# Patient Record
Sex: Female | Born: 1965 | Race: White | Hispanic: No | Marital: Married | Smoking: Never smoker
Health system: Southern US, Community
[De-identification: ages and names within clinical notes are randomized; demographics above are authoritative.]

## PROBLEM LIST (undated history)

## (undated) DIAGNOSIS — F329 Major depressive disorder, single episode, unspecified: Secondary | ICD-10-CM

## (undated) DIAGNOSIS — B2790 Infectious mononucleosis, unspecified without complication: Secondary | ICD-10-CM

## (undated) DIAGNOSIS — F419 Anxiety disorder, unspecified: Secondary | ICD-10-CM

## (undated) DIAGNOSIS — O24419 Gestational diabetes mellitus in pregnancy, unspecified control: Secondary | ICD-10-CM

## (undated) DIAGNOSIS — B019 Varicella without complication: Secondary | ICD-10-CM

## (undated) DIAGNOSIS — F32A Depression, unspecified: Secondary | ICD-10-CM

## (undated) DIAGNOSIS — M1990 Unspecified osteoarthritis, unspecified site: Secondary | ICD-10-CM

## (undated) DIAGNOSIS — G43909 Migraine, unspecified, not intractable, without status migrainosus: Secondary | ICD-10-CM

## (undated) DIAGNOSIS — R7303 Prediabetes: Secondary | ICD-10-CM

## (undated) DIAGNOSIS — E785 Hyperlipidemia, unspecified: Secondary | ICD-10-CM

## (undated) DIAGNOSIS — T7840XA Allergy, unspecified, initial encounter: Secondary | ICD-10-CM

## (undated) DIAGNOSIS — D369 Benign neoplasm, unspecified site: Secondary | ICD-10-CM

## (undated) HISTORY — DX: Infectious mononucleosis, unspecified without complication: B27.90

## (undated) HISTORY — DX: Allergy, unspecified, initial encounter: T78.40XA

## (undated) HISTORY — PX: CARPAL TUNNEL RELEASE: SHX101

## (undated) HISTORY — DX: Anxiety disorder, unspecified: F41.9

## (undated) HISTORY — DX: Major depressive disorder, single episode, unspecified: F32.9

## (undated) HISTORY — DX: Varicella without complication: B01.9

## (undated) HISTORY — DX: Migraine, unspecified, not intractable, without status migrainosus: G43.909

## (undated) HISTORY — DX: Depression, unspecified: F32.A

## (undated) HISTORY — DX: Gestational diabetes mellitus in pregnancy, unspecified control: O24.419

## (undated) HISTORY — DX: Hyperlipidemia, unspecified: E78.5

## (undated) HISTORY — DX: Benign neoplasm, unspecified site: D36.9

---
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
## 2000-08-31 ENCOUNTER — Encounter: Admission: RE | Admit: 2000-08-31 | Discharge: 2000-08-31 | Payer: Self-pay | Admitting: Internal Medicine

## 2005-08-31 ENCOUNTER — Ambulatory Visit: Payer: Self-pay

## 2005-09-28 ENCOUNTER — Ambulatory Visit: Payer: Self-pay | Admitting: Family Medicine

## 2013-11-19 ENCOUNTER — Ambulatory Visit: Payer: Self-pay | Admitting: Family Medicine

## 2015-10-30 IMAGING — CR RIGHT INDEX FINGER 2+V
1 series · 4 of 4 positions shown · non-contrast
Comparison: None.

CLINICAL DATA: Naana hit 2nd digit.

EXAM:
RIGHT INDEX FINGER 2+V

[Series 1: x finger obl right · 0.14mm/px · 4 of 4 slices shown]
[im 1/4]
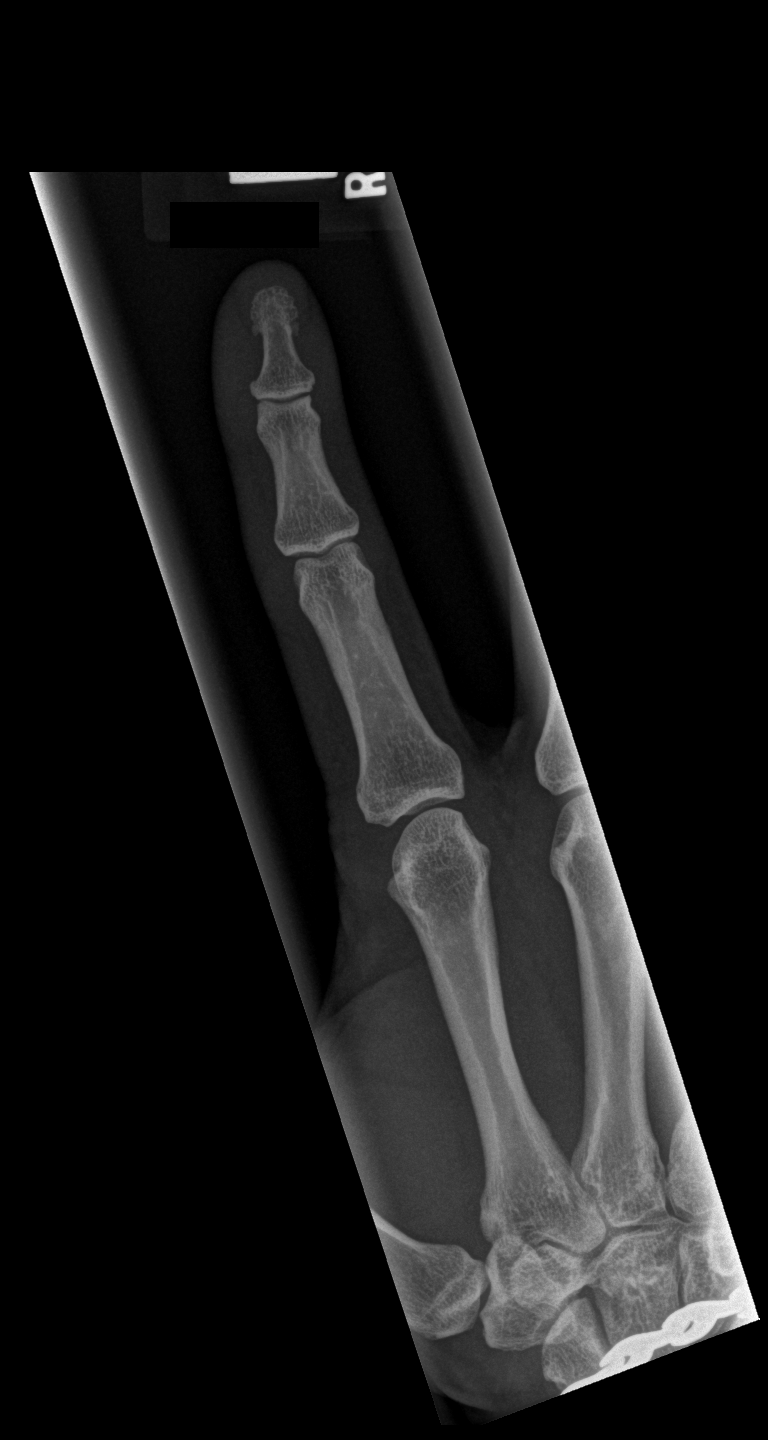
[im 2/4]
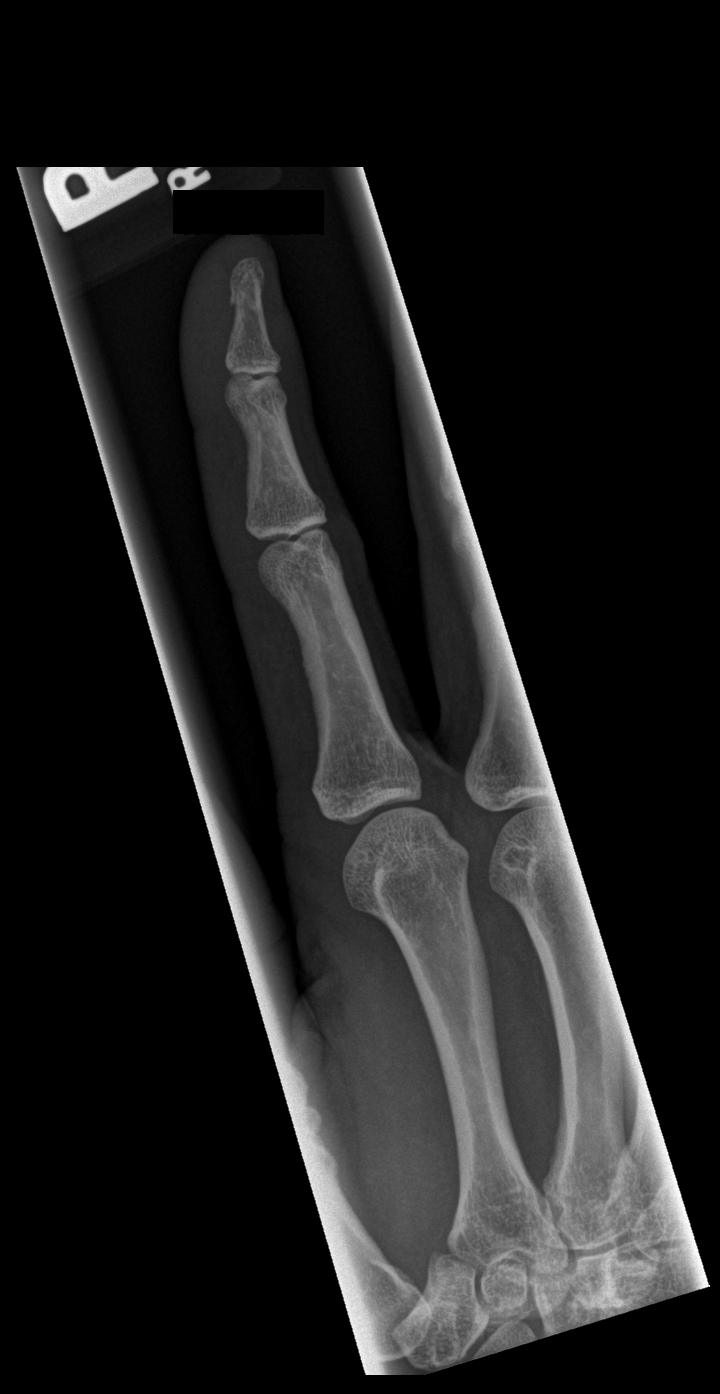
[im 3/4]
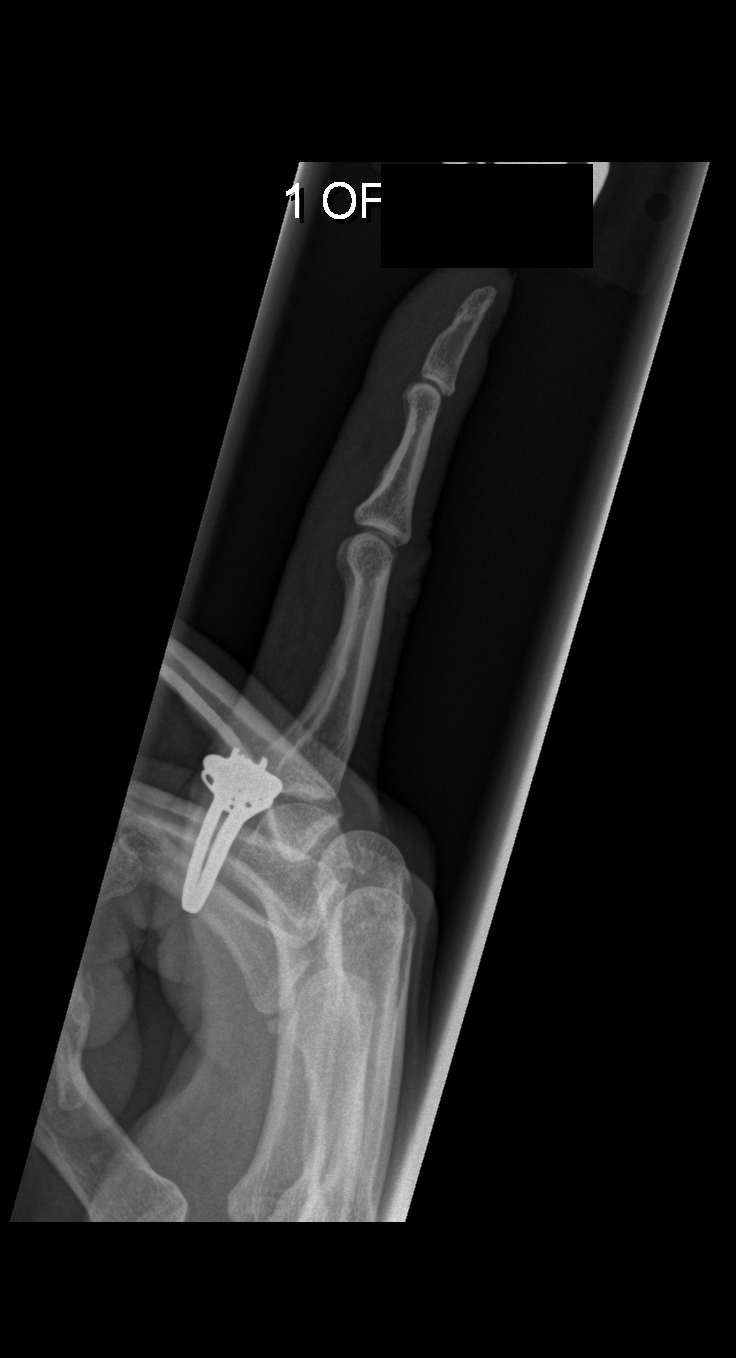
[im 4/4]
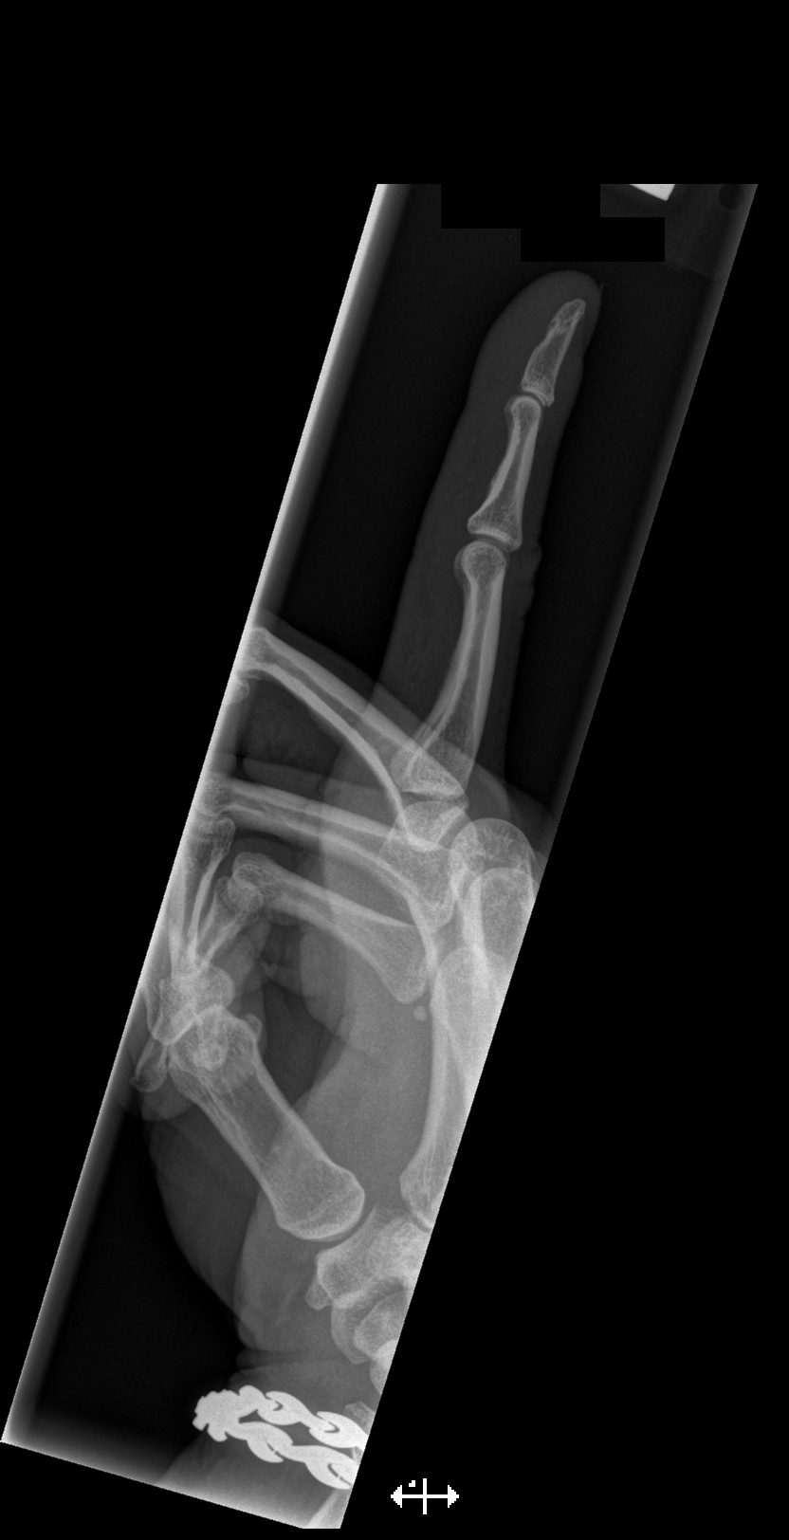

[4 of 4 positions shown; findings below may reference images not displayed]

FINDINGS: There is a possible minimally displaced fracture involving the ulnar
aspect of the tuft of the distal phalanx. No other fractures are
identified. Alignment is normal. No radiopaque foreign body or soft
tissue gas.
IMPRESSION: Possible tuft fracture.

## 2015-12-01 ENCOUNTER — Encounter: Payer: Self-pay | Admitting: Family Medicine

## 2015-12-01 ENCOUNTER — Ambulatory Visit (INDEPENDENT_AMBULATORY_CARE_PROVIDER_SITE_OTHER): Payer: 59 | Admitting: Family Medicine

## 2015-12-01 VITALS — BP 124/82 | HR 83 | Temp 99.0°F | Ht 64.25 in | Wt 178.2 lb

## 2015-12-01 DIAGNOSIS — M7062 Trochanteric bursitis, left hip: Secondary | ICD-10-CM | POA: Diagnosis not present

## 2015-12-01 DIAGNOSIS — Z0000 Encounter for general adult medical examination without abnormal findings: Secondary | ICD-10-CM | POA: Diagnosis not present

## 2015-12-01 DIAGNOSIS — F418 Other specified anxiety disorders: Secondary | ICD-10-CM

## 2015-12-01 DIAGNOSIS — J209 Acute bronchitis, unspecified: Secondary | ICD-10-CM

## 2015-12-01 MED ORDER — DOXYCYCLINE HYCLATE 100 MG PO TABS: 100.0000 mg | ORAL_TABLET | Freq: Two times a day (BID) | ORAL | Status: DC

## 2015-12-01 MED ORDER — HYDROCOD POLST-CPM POLST ER 10-8 MG/5ML PO SUER: 5.0000 mL | Freq: Two times a day (BID) | ORAL | Status: DC | PRN

## 2015-12-01 MED ORDER — MELOXICAM 15 MG PO TABS
15.0000 mg | ORAL_TABLET | Freq: Every day | ORAL | Status: DC
Start: 2015-12-01 — End: 2016-03-09

## 2015-12-01 NOTE — Progress Notes (Signed)
Pre visit review using our clinic review tool, if applicable. No additional management support is needed unless otherwise documented below in the visit note. 

## 2015-12-01 NOTE — Patient Instructions (Signed)
It was nice to see you today.  Use the cough medication at night.  Take the antibiotic as prescribed.  Be sure to get your mammogram.  Use the Mobic daily as needed for your hip pain.  Follow up annually or sooner if needed.  Take care  Dr. Lacinda Axon

## 2015-12-02 ENCOUNTER — Encounter: Payer: Self-pay | Admitting: Family Medicine

## 2015-12-02 DIAGNOSIS — M7060 Trochanteric bursitis, unspecified hip: Secondary | ICD-10-CM | POA: Insufficient documentation

## 2015-12-02 DIAGNOSIS — F418 Other specified anxiety disorders: Secondary | ICD-10-CM | POA: Insufficient documentation

## 2015-12-02 DIAGNOSIS — Z0000 Encounter for general adult medical examination without abnormal findings: Secondary | ICD-10-CM | POA: Insufficient documentation

## 2015-12-02 NOTE — Assessment & Plan Note (Signed)
New problem. Treating with Mobic.

## 2015-12-02 NOTE — Assessment & Plan Note (Signed)
Discussed increase of celexa. Patient declined and wanted to stay on current dose. Current stressors are centered upon work.

## 2015-12-02 NOTE — Progress Notes (Addendum)
Subjective:  Patient ID: Tamara Weeks, female    DOB: May 18, 1966  Age: 49 y.o. MRN: [id number]  CC: Establish care; Cough, Hip pain  HPI Tamara Weeks is a 49 y.o. female presents to the clinic today to establish care. She also has 2 additional complaints (see below).  Preventative Healthcare  Pap smear: Up to date. Done in 2015.  Mammogram/Breast exam: In need of mammogram later this month. Getting via Gyn.  Colonoscopy: N/A.  Immunizations  Tetanus - Up to date per patient report (w/in last 10 years).  Pneumococcal - N/A.  Flu - received earlier in November.  Zoster - N/A.  Hepatitis C screening - N/A.  Labs: In need of labs today.  Exercise: Does not exercise regularly.  Alcohol use: See below.  Smoking/tobacco use: No.  STD/HIV testing: Via Gyn.  Regular dental exams: Yes.   Wears seat belt: Yes.   Cough  Has been present x 2 weeks.  Mildly productive.  She reports associated post nasal drip.  Cough is mildly productive.  No associated fevers.  Patient does endorse some associated shortness of breath with a cough.  He's had no relief with supportive care at home.  No exacerbating or relieving factors.  Left hip pain  Has been present for approx. 1 week.  Patient states that she hasn't area that is quite tender to palpation.  This area is located on the lateral hip.  She reports that this area is quite bothersome when she rolls over onto it at night.  No relieving factors.  She is went to chiropractor and has had little improvement.  No known inciting factor. No trauma, fall, injury. Patient does note that she sits a lot at work.  Pain is mild to moderate in severity.  PMH, Surgical Hx, Family Hx, Social History reviewed and updated as below.  Past Medical History  Diagnosis Date  . Chicken pox   . Depression   . Allergy   . Migraines    Past Surgical History  Procedure Laterality Date  . Tonsillectomy and  adenoidectomy  1975  . Cesarean section  1997   Family History  Problem Relation Age of Onset  . Arthritis Mother   . Arthritis Maternal Grandmother   . Hyperlipidemia Maternal Grandmother   . Diabetes Maternal Grandmother   . Stroke Maternal Grandfather   . Hyperlipidemia Maternal Grandfather   . Hyperlipidemia Paternal Grandmother   . Diabetes Paternal Grandmother   . Hyperlipidemia Paternal Grandfather    Social History  Substance Use Topics  . Smoking status: Never Smoker   . Smokeless tobacco: Never Used  . Alcohol Use: 2.4 oz/week    4 Standard drinks or equivalent per week   Review of Systems  Constitutional: Positive for fatigue.  HENT: Positive for postnasal drip and voice change.   Respiratory: Positive for cough and shortness of breath.   Cardiovascular: Positive for palpitations.  Musculoskeletal: Positive for arthralgias.  Psychiatric/Behavioral:       Anxiety, stress.  All other systems reviewed and are negative.  Objective:   Today's Vitals: BP 124/82 mmHg  Pulse 83  Temp(Src) 99 F (37.2 C) (Oral)  Ht 5' 4.25" (1.632 m)  Wt 178 lb 4 oz (80.854 kg)  BMI 30.36 kg/m2  SpO2 97%  LMP 11/22/2015  Physical Exam  Constitutional: She is oriented to person, place, and time. She appears well-developed. No distress.  HENT:  Head: Normocephalic and atraumatic.  Right Ear: External ear normal.  Left Ear:  External ear normal.  Moderate oropharyngeal erythema. Normal TM's bilaterally.  Eyes: Conjunctivae are normal. No scleral icterus.  Neck: Neck supple.  Cardiovascular: Normal rate and regular rhythm.   No murmur heard. Pulmonary/Chest: Effort normal and breath sounds normal. No respiratory distress. She has no wheezes. She has no rales.  Abdominal: Soft. She exhibits no distension. There is no tenderness. There is no rebound and no guarding.  Musculoskeletal:  Hip: Left Greater trochanter with tenderness to palpation.   Lymphadenopathy:    She has no  cervical adenopathy.  Neurological: She is alert and oriented to person, place, and time.  Skin: Skin is warm and dry. No rash noted.  Psychiatric: She has a normal mood and affect.  Vitals reviewed.  Assessment & Plan:   Problem List Items Addressed This Visit    Trochanteric bursitis    New problem. Treating with Mobic.      Preventative health care - Primary    Immunizations up to date. Pap smear up to date. Mammogram scheduled for later this month. Labs today: CBC, CMP, A1C, Lipid.       Depression with anxiety    Discussed increase of celexa. Patient declined and wanted to stay on current dose. Current stressors are centered upon work.      Acute bronchitis    New problem. Discussed that antibiotics have limited benefit in this setting; however, given duration of illness/lack of improvement in the past 2 weeks will treat with Doxycycline. Tussionex for cough.         Outpatient Encounter Prescriptions as of 12/01/2015  Medication Sig  . citalopram (CELEXA) 10 MG tablet Take 10 mg by mouth daily.  . fluticasone (FLONASE) 50 MCG/ACT nasal spray Place 2 sprays into both nostrils daily.  . chlorpheniramine-HYDROcodone (TUSSIONEX PENNKINETIC ER) 10-8 MG/5ML SUER Take 5 mLs by mouth every 12 (twelve) hours as needed for cough.  . doxycycline (VIBRA-TABS) 100 MG tablet Take 1 tablet (100 mg total) by mouth 2 (two) times daily.  . meloxicam (MOBIC) 15 MG tablet Take 1 tablet (15 mg total) by mouth daily.   No facility-administered encounter medications on file as of 12/01/2015.    Follow-up: Annually or sooner if needed  [place]

## 2015-12-02 NOTE — Assessment & Plan Note (Signed)
Immunizations up to date. Pap smear up to date. Mammogram scheduled for later this month. Labs today: CBC, CMP, A1C, Lipid.

## 2015-12-02 NOTE — Assessment & Plan Note (Addendum)
New problem. Discussed that antibiotics have limited benefit in this setting; however, given duration of illness/lack of improvement in the past 2 weeks will treat with Doxycycline. Tussionex for cough.

## 2015-12-17 LAB — HM MAMMOGRAPHY: HM Mammogram: NEGATIVE

## 2015-12-19 ENCOUNTER — Telehealth: Payer: Self-pay | Admitting: Family Medicine

## 2015-12-19 NOTE — Telephone Encounter (Signed)
Pt called about worse then she was before. She states she has a fever of about 100.4 and blowing out yellow mucus and coughing up yellow mucus and nose bleeding. Pt wants to know can something be called in? Pharmacy is [facility], [place]. Thank You!

## 2015-12-19 NOTE — Telephone Encounter (Signed)
Patient will need to be seen or she may go to Urgent care.

## 2015-12-19 NOTE — Telephone Encounter (Signed)
Patient was seen on 12/12 and prescribed Doxycycline and tussinex.  Please advise?

## 2015-12-19 NOTE — Telephone Encounter (Signed)
Patient said she will go to the urgent care beacause she could not wait for Tuesday to get a appointment.

## 2015-12-29 ENCOUNTER — Encounter: Payer: Self-pay | Admitting: Family Medicine

## 2015-12-29 ENCOUNTER — Ambulatory Visit (INDEPENDENT_AMBULATORY_CARE_PROVIDER_SITE_OTHER): Payer: 59 | Admitting: Family Medicine

## 2015-12-29 VITALS — BP 124/72 | HR 83 | Temp 98.4°F | Ht 64.25 in | Wt 180.0 lb

## 2015-12-29 DIAGNOSIS — J019 Acute sinusitis, unspecified: Secondary | ICD-10-CM | POA: Diagnosis not present

## 2015-12-29 DIAGNOSIS — R059 Cough, unspecified: Secondary | ICD-10-CM | POA: Insufficient documentation

## 2015-12-29 DIAGNOSIS — R05 Cough: Secondary | ICD-10-CM | POA: Diagnosis not present

## 2015-12-29 MED ORDER — PREDNISONE 50 MG PO TABS: ORAL_TABLET | ORAL | Status: DC

## 2015-12-29 MED ORDER — MOXIFLOXACIN HCL 400 MG PO TABS: 400.0000 mg | ORAL_TABLET | Freq: Every day | ORAL | Status: DC

## 2015-12-29 MED ORDER — HYDROCOD POLST-CPM POLST ER 10-8 MG/5ML PO SUER: 5.0000 mL | Freq: Two times a day (BID) | ORAL | Status: DC | PRN

## 2015-12-29 NOTE — Progress Notes (Signed)
Subjective:  Patient ID: Tamara Weeks, female    DOB: 19-Nov-1966  Age: 50 y.o. MRN: [id number]  CC: Still sick  HPI:  50 year old female presents to clinic today for an acute visit with complaints of not feeling well.  Patient has been sick for several weeks now. I initially saw her on 12/12 and diagnosed her with acute bronchitis. She was treated with supportive care and was given doxycycline to be filled if she failed to improve or worsen. She subsequently filled this medication and had some improvement briefly and then got sick again. She states that she been experiencing continued cough as well as postnasal drip and nasal obstruction/congestion. Her symptoms have been persistent prodding her to seek medical attention at a local urgent care approximately week ago. She states at that time she was diagnosed with acute sinusitis. She was treated with Biaxin.  She presents today reporting that she is still no better. She continues to have severe cough, nasal drainage, and nasal obstruction. She reports associated fatigue. She has had fever but none recently. No relieving factors with the above treatments. No known exacerbating factors.  Social Hx   Social History   Social History  . Marital Status: Married    Spouse Name: N/A  . Number of Children: N/A  . Years of Education: N/A   Social History Main Topics  . Smoking status: Never Smoker   . Smokeless tobacco: Never Used  . Alcohol Use: 2.4 oz/week    4 Standard drinks or equivalent per week  . Drug Use: No  . Sexual Activity: No   Other Topics Concern  . None   Social History Narrative   Review of Systems  Constitutional:       No recent fever.   HENT: Positive for congestion and sinus pressure.   Respiratory: Positive for cough.    Objective:  BP 124/72 mmHg  Pulse 83  Temp(Src) 98.4 F (36.9 C) (Oral)  Ht 5' 4.25" (1.632 m)  Wt 180 lb (81.647 kg)  BMI 30.65 kg/m2  SpO2 98%  LMP 11/22/2015  BP/Weight  12/29/2015 [id number]  Systolic BP [id number]  Diastolic BP 72 82  Wt. (Lbs) 180 178.25  BMI 30.65 30.36   Physical Exam  Constitutional: She appears well-developed. No distress.  HENT:  Head: Normocephalic and atraumatic.  Nose: Mucosal edema present.  Mouth/Throat: No oropharyngeal exudate.  Mild maxillary sinus tenderness.  Cardiovascular: Normal rate and regular rhythm.   Pulmonary/Chest: Effort normal and breath sounds normal.  Neurological: She is alert.  Psychiatric: She has a normal mood and affect.  Vitals reviewed.   Assessment & Plan:   Problem List Items Addressed This Visit    Subacute sinusitis - Primary    New problem. Persistent symptoms despite antibiotic therapy. Treating with Prednisone and Avelox. If fails to improve will need to see ENT.      Relevant Medications   predniSONE (DELTASONE) 50 MG tablet   moxifloxacin (AVELOX) 400 MG tablet   chlorpheniramine-HYDROcodone (TUSSIONEX PENNKINETIC ER) 10-8 MG/5ML SUER   Other Relevant Orders   Ambulatory referral to ENT   Cough    Treating with Tussionex.      Relevant Medications   chlorpheniramine-HYDROcodone (TUSSIONEX PENNKINETIC ER) 10-8 MG/5ML SUER      Meds ordered this encounter  Medications  . predniSONE (DELTASONE) 50 MG tablet    Sig: 1 tablet daily x 5 days.    Dispense:  5 tablet    Refill:  0  .  moxifloxacin (AVELOX) 400 MG tablet    Sig: Take 1 tablet (400 mg total) by mouth daily at 8 pm.    Dispense:  10 tablet    Refill:  0  . chlorpheniramine-HYDROcodone (TUSSIONEX PENNKINETIC ER) 10-8 MG/5ML SUER    Sig: Take 5 mLs by mouth every 12 (twelve) hours as needed for cough.    Dispense:  140 mL    Refill:  0    Follow-up: PRN  [place]

## 2015-12-29 NOTE — Assessment & Plan Note (Signed)
Treating with Tussionex.

## 2015-12-29 NOTE — Patient Instructions (Addendum)
Continue the flonase.  Start the prednisone.  Take the antibiotic (stop the celexa while on it).  We will have you see ENT.  Take care  Dr. Lacinda Axon

## 2015-12-29 NOTE — Progress Notes (Signed)
Pre visit review using our clinic review tool, if applicable. No additional management support is needed unless otherwise documented below in the visit note. 

## 2015-12-29 NOTE — Assessment & Plan Note (Signed)
New problem. Persistent symptoms despite antibiotic therapy. Treating with Prednisone and Avelox. If fails to improve will need to see ENT.

## 2016-01-09 ENCOUNTER — Encounter: Payer: Self-pay | Admitting: Family Medicine

## 2016-01-13 ENCOUNTER — Encounter: Payer: Self-pay | Admitting: Family Medicine

## 2016-02-18 ENCOUNTER — Encounter: Payer: 59 | Admitting: Family Medicine

## 2016-03-03 ENCOUNTER — Encounter: Payer: 59 | Admitting: Family Medicine

## 2016-03-03 ENCOUNTER — Other Ambulatory Visit: Payer: Self-pay | Admitting: Family Medicine

## 2016-03-04 LAB — CBC WITH DIFFERENTIAL/PLATELET
BASOS ABS: 0 10*3/uL (ref 0.0–0.2)
BASOS: 0 %
EOS (ABSOLUTE): 0.1 10*3/uL (ref 0.0–0.4)
EOS: 1 %
HEMATOCRIT: 42.2 % (ref 34.0–46.6)
HEMOGLOBIN: 14.6 g/dL (ref 11.1–15.9)
IMMATURE GRANS (ABS): 0 10*3/uL (ref 0.0–0.1)
Immature Granulocytes: 0 %
LYMPHS ABS: 1.7 10*3/uL (ref 0.7–3.1)
LYMPHS: 31 %
MCH: 29.6 pg (ref 26.6–33.0)
MCHC: 34.6 g/dL (ref 31.5–35.7)
MCV: 85 fL (ref 79–97)
MONOCYTES: 6 %
Monocytes Absolute: 0.3 10*3/uL (ref 0.1–0.9)
NEUTROS ABS: 3.4 10*3/uL (ref 1.4–7.0)
Neutrophils: 62 %
PLATELETS: 356 10*3/uL (ref 150–379)
RBC: 4.94 x10E6/uL (ref 3.77–5.28)
RDW: 13.3 % (ref 12.3–15.4)
WBC: 5.5 10*3/uL (ref 3.4–10.8)

## 2016-03-04 LAB — COMPREHENSIVE METABOLIC PANEL
ALBUMIN: 4.5 g/dL (ref 3.5–5.5)
ALK PHOS: 38 IU/L — AB (ref 39–117)
ALT: 28 IU/L (ref 0–32)
AST: 30 IU/L (ref 0–40)
Albumin/Globulin Ratio: 1.9 (ref 1.2–2.2)
BUN / CREAT RATIO: 13 (ref 9–23)
BUN: 10 mg/dL (ref 6–24)
Bilirubin Total: 0.6 mg/dL (ref 0.0–1.2)
CALCIUM: 9.4 mg/dL (ref 8.7–10.2)
CO2: 24 mmol/L (ref 18–29)
CREATININE: 0.77 mg/dL (ref 0.57–1.00)
Chloride: 94 mmol/L — ABNORMAL LOW (ref 96–106)
GFR calc non Af Amer: 90 mL/min/{1.73_m2} (ref >59)
GFR, EST AFRICAN AMERICAN: 104 mL/min/{1.73_m2} (ref >59)
GLUCOSE: 93 mg/dL (ref 65–99)
Globulin, Total: 2.4 g/dL (ref 1.5–4.5)
Potassium: 4.1 mmol/L (ref 3.5–5.2)
Sodium: 137 mmol/L (ref 134–144)
TOTAL PROTEIN: 6.9 g/dL (ref 6.0–8.5)

## 2016-03-04 LAB — LIPID PANEL W/O CHOL/HDL RATIO
CHOLESTEROL TOTAL: 214 mg/dL — AB (ref 100–199)
HDL: 56 mg/dL (ref >39)
LDL CALC: 140 mg/dL — AB (ref 0–99)
Triglycerides: 92 mg/dL (ref 0–149)
VLDL CHOLESTEROL CAL: 18 mg/dL (ref 5–40)

## 2016-03-04 LAB — HGB A1C W/O EAG: HEMOGLOBIN A1C: 5.8 % — AB (ref 4.8–5.6)

## 2016-03-09 ENCOUNTER — Ambulatory Visit (INDEPENDENT_AMBULATORY_CARE_PROVIDER_SITE_OTHER): Payer: 59 | Admitting: Family Medicine

## 2016-03-09 ENCOUNTER — Encounter: Payer: Self-pay | Admitting: Family Medicine

## 2016-03-09 VITALS — BP 102/72 | HR 87 | Temp 98.1°F | Ht 64.75 in | Wt 179.5 lb

## 2016-03-09 DIAGNOSIS — Z0000 Encounter for general adult medical examination without abnormal findings: Secondary | ICD-10-CM | POA: Diagnosis not present

## 2016-03-09 MED ORDER — CITALOPRAM HYDROBROMIDE 20 MG PO TABS: 20.0000 mg | ORAL_TABLET | Freq: Every day | ORAL | Status: DC

## 2016-03-09 NOTE — Progress Notes (Signed)
Pre visit review using our clinic review tool, if applicable. No additional management support is needed unless otherwise documented below in the visit note. 

## 2016-03-09 NOTE — Patient Instructions (Addendum)
It was nice to see you today.  I have refilled your celexa.  Follow up in 1 year or sooner if needed.  We will call with your colonoscopy.    Follow up:  Return in about 1 year (around 03/09/2017).  Take care  Dr. Lacinda Axon  Health Maintenance, Female Adopting a healthy lifestyle and getting preventive care can go a long way to promote health and wellness. Talk with your health care provider about what schedule of regular examinations is right for you. This is a good chance for you to check in with your provider about disease prevention and staying healthy. In between checkups, there are plenty of things you can do on your own. Experts have done a lot of research about which lifestyle changes and preventive measures are most likely to keep you healthy. Ask your health care provider for more information. WEIGHT AND DIET  Eat a healthy diet  Be sure to include plenty of vegetables, fruits, low-fat dairy products, and lean protein.  Do not eat a lot of foods high in solid fats, added sugars, or salt.  Get regular exercise. This is one of the most important things you can do for your health.  Most adults should exercise for at least 150 minutes each week. The exercise should increase your heart rate and make you sweat (moderate-intensity exercise).  Most adults should also do strengthening exercises at least twice a week. This is in addition to the moderate-intensity exercise.  Maintain a healthy weight  Body mass index (BMI) is a measurement that can be used to identify possible weight problems. It estimates body fat based on height and weight. Your health care provider can help determine your BMI and help you achieve or maintain a healthy weight.  For females 37 years of age and older:   A BMI below 18.5 is considered underweight.  A BMI of 18.5 to 24.9 is normal.  A BMI of 25 to 29.9 is considered overweight.  A BMI of 30 and above is considered obese.  Watch levels of  cholesterol and blood lipids  You should start having your blood tested for lipids and cholesterol at 50 years of age, then have this test every 5 years.  You may need to have your cholesterol levels checked more often if:  Your lipid or cholesterol levels are high.  You are older than 50 years of age.  You are at high risk for heart disease.  CANCER SCREENING   Lung Cancer  Lung cancer screening is recommended for adults 53-16 years old who are at high risk for lung cancer because of a history of smoking.  A yearly low-dose CT scan of the lungs is recommended for people who:  Currently smoke.  Have quit within the past 15 years.  Have at least a 30-pack-year history of smoking. A pack year is smoking an average of one pack of cigarettes a day for 1 year.  Yearly screening should continue until it has been 15 years since you quit.  Yearly screening should stop if you develop a health problem that would prevent you from having lung cancer treatment.  Breast Cancer  Practice breast self-awareness. This means understanding how your breasts normally appear and feel.  It also means doing regular breast self-exams. Let your health care provider know about any changes, no matter how small.  If you are in your 20s or 30s, you should have a clinical breast exam (CBE) by a health care provider every 1-3 years  as part of a regular health exam.  If you are 59 or older, have a CBE every year. Also consider having a breast X-ray (mammogram) every year.  If you have a family history of breast cancer, talk to your health care provider about genetic screening.  If you are at high risk for breast cancer, talk to your health care provider about having an MRI and a mammogram every year.  Breast cancer gene (BRCA) assessment is recommended for women who have family members with BRCA-related cancers. BRCA-related cancers include:  Breast.  Ovarian.  Tubal.  Peritoneal  cancers.  Results of the assessment will determine the need for genetic counseling and BRCA1 and BRCA2 testing. Cervical Cancer Your health care provider may recommend that you be screened regularly for cancer of the pelvic organs (ovaries, uterus, and vagina). This screening involves a pelvic examination, including checking for microscopic changes to the surface of your cervix (Pap test). You may be encouraged to have this screening done every 3 years, beginning at age 41.  For women ages 13-65, health care providers may recommend pelvic exams and Pap testing every 3 years, or they may recommend the Pap and pelvic exam, combined with testing for human papilloma virus (HPV), every 5 years. Some types of HPV increase your risk of cervical cancer. Testing for HPV may also be done on women of any age with unclear Pap test results.  Other health care providers may not recommend any screening for nonpregnant women who are considered low risk for pelvic cancer and who do not have symptoms. Ask your health care provider if a screening pelvic exam is right for you.  If you have had past treatment for cervical cancer or a condition that could lead to cancer, you need Pap tests and screening for cancer for at least 20 years after your treatment. If Pap tests have been discontinued, your risk factors (such as having a new sexual partner) need to be reassessed to determine if screening should resume. Some women have medical problems that increase the chance of getting cervical cancer. In these cases, your health care provider may recommend more frequent screening and Pap tests. Colorectal Cancer  This type of cancer can be detected and often prevented.  Routine colorectal cancer screening usually begins at 50 years of age and continues through 50 years of age.  Your health care provider may recommend screening at an earlier age if you have risk factors for colon cancer.  Your health care provider may also  recommend using home test kits to check for hidden blood in the stool.  A small camera at the end of a tube can be used to examine your colon directly (sigmoidoscopy or colonoscopy). This is done to check for the earliest forms of colorectal cancer.  Routine screening usually begins at age 9.  Direct examination of the colon should be repeated every 5-10 years through 50 years of age. However, you may need to be screened more often if early forms of precancerous polyps or small growths are found. Skin Cancer  Check your skin from head to toe regularly.  Tell your health care provider about any new moles or changes in moles, especially if there is a change in a mole's shape or color.  Also tell your health care provider if you have a mole that is larger than the size of a pencil eraser.  Always use sunscreen. Apply sunscreen liberally and repeatedly throughout the [place].  Protect yourself by wearing long sleeves,  pants, a wide-brimmed hat, and sunglasses whenever you are outside. HEART DISEASE, DIABETES, AND HIGH BLOOD PRESSURE   High blood pressure causes heart disease and increases the risk of stroke. High blood pressure is more likely to develop in:  People who have blood pressure in the high end of the normal range (130-139/85-89 mm Hg).  People who are overweight or obese.  People who are African American.  If you are 22-60 years of age, have your blood pressure checked every 3-5 years. If you are 61 years of age or older, have your blood pressure checked every year. You should have your blood pressure measured twice--once when you are at a hospital or clinic, and once when you are not at a hospital or clinic. Record the average of the two measurements. To check your blood pressure when you are not at a hospital or clinic, you can use:  An automated blood pressure machine at a pharmacy.  A home blood pressure monitor.  If you are between 29 years and 55 years old, ask your health  care provider if you should take aspirin to prevent strokes.  Have regular diabetes screenings. This involves taking a blood sample to check your fasting blood sugar level.  If you are at a normal weight and have a low risk for diabetes, have this test once every three years after 50 years of age.  If you are overweight and have a high risk for diabetes, consider being tested at a younger age or more often. PREVENTING INFECTION  Hepatitis B  If you have a higher risk for hepatitis B, you should be screened for this virus. You are considered at high risk for hepatitis B if:  You were born in a country where hepatitis B is common. Ask your health care provider which countries are considered high risk.  Your parents were born in a high-risk country, and you have not been immunized against hepatitis B (hepatitis B vaccine).  You have HIV or AIDS.  You use needles to inject street drugs.  You live with someone who has hepatitis B.  You have had sex with someone who has hepatitis B.  You get hemodialysis treatment.  You take certain medicines for conditions, including cancer, organ transplantation, and autoimmune conditions. Hepatitis C  Blood testing is recommended for:  Everyone born from [birth year] through 1965.  Anyone with known risk factors for hepatitis C. Sexually transmitted infections (STIs)  You should be screened for sexually transmitted infections (STIs) including gonorrhea and chlamydia if:  You are sexually active and are younger than 50 years of age.  You are older than 50 years of age and your health care provider tells you that you are at risk for this type of infection.  Your sexual activity has changed since you were last screened and you are at an increased risk for chlamydia or gonorrhea. Ask your health care provider if you are at risk.  If you do not have HIV, but are at risk, it may be recommended that you take a prescription medicine daily to prevent HIV  infection. This is called pre-exposure prophylaxis (PrEP). You are considered at risk if:  You are sexually active and do not regularly use condoms or know the HIV status of your partner(s).  You take drugs by injection.  You are sexually active with a partner who has HIV. Talk with your health care provider about whether you are at high risk of being infected with HIV. If you choose to  begin PrEP, you should first be tested for HIV. You should then be tested every 3 months for as long as you are taking PrEP.  PREGNANCY   If you are premenopausal and you may become pregnant, ask your health care provider about preconception counseling.  If you may become pregnant, take 400 to 800 micrograms (mcg) of folic acid every day.  If you want to prevent pregnancy, talk to your health care provider about birth control (contraception). OSTEOPOROSIS AND MENOPAUSE   Osteoporosis is a disease in which the bones lose minerals and strength with aging. This can result in serious bone fractures. Your risk for osteoporosis can be identified using a bone density scan.  If you are 10 years of age or older, or if you are at risk for osteoporosis and fractures, ask your health care provider if you should be screened.  Ask your health care provider whether you should take a calcium or vitamin D supplement to lower your risk for osteoporosis.  Menopause may have certain physical symptoms and risks.  Hormone replacement therapy may reduce some of these symptoms and risks. Talk to your health care provider about whether hormone replacement therapy is right for you.  HOME CARE INSTRUCTIONS   Schedule regular health, dental, and eye exams.  Stay current with your immunizations.   Do not use any tobacco products including cigarettes, chewing tobacco, or electronic cigarettes.  If you are pregnant, do not drink alcohol.  If you are breastfeeding, limit how much and how often you drink alcohol.  Limit  alcohol intake to no more than 1 drink per day for nonpregnant women. One drink equals 12 ounces of beer, 5 ounces of wine, or 1 ounces of hard liquor.  Do not use street drugs.  Do not share needles.  Ask your health care provider for help if you need support or information about quitting drugs.  Tell your health care provider if you often feel depressed.  Tell your health care provider if you have ever been abused or do not feel safe at home.   This information is not intended to replace advice given to you by your health care provider. Make sure you discuss any questions you have with your health care provider.   Document Released: 06/21/2011 Document Revised: 12/27/2014 Document Reviewed: 11/07/2013 Elsevier Interactive Patient Education [employer].

## 2016-03-09 NOTE — Progress Notes (Signed)
Subjective:  Patient ID: Tamara Weeks, female    DOB: 05/25/1966  Age: 50 y.o. MRN: [id number]  CC: Physical  HPI:  50 year old female presents for an annual physical exam.  Preventative Healthcare  Pap smear: Up to date. Due in 2018.  Mammogram: Up to date. Was done in Dec and was negative.    Colonoscopy: Just turned 50. In need of colonoscopy. Will discuss today.   Immunizations  Tetanus - Within the last 10 years.  Pneumococcal - Not indicated.   Flu - Up to date.   Zoster - Not indicated.   Hepatitis C screening - Not indicated.   Labs: Recently had annual/screening labs. Will review today.   Exercise: Starting to exercise and diet in attempt to lose weight.   Alcohol use: See below.   Smoking/tobacco use: Nonsmoker.  PMH, Surgical Hx, Family Hx, Social History reviewed and updated as below.  Past Medical History  Diagnosis Date  . Chicken pox   . Depression   . Allergy   . Migraines    Past Surgical History  Procedure Laterality Date  . Tonsillectomy and adenoidectomy  1975  . Cesarean section  1997   Family History  Problem Relation Age of Onset  . Arthritis Mother   . Arthritis Maternal Grandmother   . Hyperlipidemia Maternal Grandmother   . Diabetes Maternal Grandmother   . Stroke Maternal Grandfather   . Hyperlipidemia Maternal Grandfather   . Hyperlipidemia Paternal Grandmother   . Diabetes Paternal Grandmother   . Hyperlipidemia Paternal Grandfather     Social History  Substance Use Topics  . Smoking status: Never Smoker   . Smokeless tobacco: Never Used  . Alcohol Use: 2.4 oz/week    4 Standard drinks or equivalent per week   Review of Systems  Psychiatric/Behavioral:       Anxiety/depression.  All other systems reviewed and are negative.  Objective:  BP 102/72 mmHg  Pulse 87  Temp(Src) 98.1 F (36.7 C) (Oral)  Ht 5' 4.75" (1.645 m)  Wt 179 lb 8 oz (81.421 kg)  BMI 30.09 kg/m2  SpO2 96%  LMP  03/02/2016  BP/Weight 03/09/2016 12/29/2015 [id number]  Systolic BP A999333 A999333 A999333  Diastolic BP 72 72 82  Wt. (Lbs) 179.5 180 178.25  BMI 30.09 30.65 30.36   Physical Exam  Constitutional: She is oriented to person, place, and time. She appears well-developed. No distress.  HENT:  Head: Normocephalic and atraumatic.  Eyes: Conjunctivae are normal.  Neck: Neck supple.  Cardiovascular: Normal rate and regular rhythm.   Pulmonary/Chest: Effort normal and breath sounds normal.  Abdominal: Soft. She exhibits no distension. There is no tenderness. There is no rebound.  Musculoskeletal:  L hip - trochanter tender to palpation.  Neurological: She is alert and oriented to person, place, and time.  Skin: Skin is warm and dry. No rash noted.  Psychiatric: She has a normal mood and affect.  Vitals reviewed.  Lab Results  Component Value Date   WBC 5.5 03/03/2016   HCT 42.2 03/03/2016   PLT 356 03/03/2016   GLUCOSE 93 03/03/2016   CHOL 214* 03/03/2016   TRIG 92 03/03/2016   HDL 56 03/03/2016   LDLCALC 140* 03/03/2016   ALT 28 03/03/2016   AST 30 03/03/2016   NA 137 03/03/2016   K 4.1 03/03/2016   CL 94* 03/03/2016   CREATININE 0.77 03/03/2016   BUN 10 03/03/2016   CO2 24 03/03/2016   HGBA1C 5.8* 03/03/2016  Assessment & Plan:   Problem List Items Addressed This Visit    Well woman exam (no gynecological exam) - Primary   Preventative health care    Pap smear and mammogram up-to-date. Flu and Tdap up to date per patient. Discussed lab results today. Patient prediabetic with mildly elevated cholesterol. Discussed diet and exercise. Patient is currently trying to lose weight. Medications reviewed. Discussed colonoscopy with patient today. Will place a referral.         Meds ordered this encounter  Medications  . DISCONTD: citalopram (CELEXA) 20 MG tablet    Sig: Take 1 tablet (20 mg total) by mouth daily.    Dispense:  90 tablet    Refill:  3  . citalopram (CELEXA) 20  MG tablet    Sig: Take 1 tablet (20 mg total) by mouth daily.    Dispense:  90 tablet    Refill:  3    Follow-up: Return in about 1 year (around 03/09/2017).  [place]

## 2016-03-10 DIAGNOSIS — Z0000 Encounter for general adult medical examination without abnormal findings: Secondary | ICD-10-CM | POA: Insufficient documentation

## 2016-03-10 NOTE — Assessment & Plan Note (Signed)
Pap smear and mammogram up-to-date. Flu and Tdap up to date per patient. Discussed lab results today. Patient prediabetic with mildly elevated cholesterol. Discussed diet and exercise. Patient is currently trying to lose weight. Medications reviewed. Discussed colonoscopy with patient today. Will place a referral.

## 2016-09-07 ENCOUNTER — Telehealth: Payer: Self-pay | Admitting: *Deleted

## 2016-09-07 MED ORDER — CITALOPRAM HYDROBROMIDE 20 MG PO TABS: 20.0000 mg | ORAL_TABLET | Freq: Every day | ORAL | 0 refills | Status: DC

## 2016-09-07 NOTE — Telephone Encounter (Signed)
Refill sent message sent to patient OV needed for further refills.

## 2016-09-07 NOTE — Telephone Encounter (Signed)
Patient has requested a medication refill for citalopram Pharmacy [facility]

## 2016-09-15 ENCOUNTER — Encounter: Payer: Self-pay | Admitting: Family Medicine

## 2016-09-15 ENCOUNTER — Ambulatory Visit (INDEPENDENT_AMBULATORY_CARE_PROVIDER_SITE_OTHER): Payer: 59 | Admitting: Family Medicine

## 2016-09-15 ENCOUNTER — Encounter (INDEPENDENT_AMBULATORY_CARE_PROVIDER_SITE_OTHER): Payer: Self-pay

## 2016-09-15 VITALS — BP 120/88 | HR 89 | Temp 98.5°F | Wt 171.2 lb

## 2016-09-15 DIAGNOSIS — F418 Other specified anxiety disorders: Secondary | ICD-10-CM

## 2016-09-15 DIAGNOSIS — N951 Menopausal and female climacteric states: Secondary | ICD-10-CM | POA: Diagnosis not present

## 2016-09-15 DIAGNOSIS — R002 Palpitations: Secondary | ICD-10-CM | POA: Insufficient documentation

## 2016-09-15 NOTE — Assessment & Plan Note (Addendum)
Worsened recently. Recently started back on Celexa after being off for several weeks. She will continue with Celexa. I discussed addition of another medicine such as Klonopin for short-term use though she wanted to hold off on this at this time. She'll call us back and let us know if she needs this in the short-term until the Celexa is fully effective. She'll continue to monitor. Given return precautions.

## 2016-09-15 NOTE — Assessment & Plan Note (Signed)
Patient has not had a menstrual cycle in the last 3 months. I offered a urine pregnancy test though she declined. She'll continue to monitor and when she gets to 12 months without a menstrual cycle she'll be considered postmenopausal.

## 2016-09-15 NOTE — Progress Notes (Signed)
  Tommi Rumps, MD Phone: [phone]  Tamara Weeks is a 51 y.o. female who presents today for follow-up.  Anxiety/depression: Patient notes this has worsened recently. She ran out of Celexa about 3 weeks ago. She thought she could do without it though she had significant worsening of her anxiety and depression. No SI. Notes she's had issues with her heart racing with this. Inability to go to sleep. No chest pain. No prior history of palpitations.  Perimenopause: Patient notes she's been 3 months without a period. Previously she was having regular periods though they were less heavy. She's not been sexually active in a long time.  PMH: nonsmoker.   ROS see history of present illness  Objective  Physical Exam Vitals:   09/15/16 1540  BP: 120/88  Pulse: 89  Temp: 98.5 F (36.9 C)    BP Readings from Last 3 Encounters:  09/15/16 120/88  03/09/16 102/72  12/29/15 124/72   Wt Readings from Last 3 Encounters:  09/15/16 171 lb 4 oz (77.7 kg)  03/09/16 179 lb 8 oz (81.4 kg)  12/29/15 180 lb (81.6 kg)    Physical Exam  Constitutional: No distress.  HENT:  Head: Normocephalic and atraumatic.  Cardiovascular: Normal rate, regular rhythm and normal heart sounds.   Pulmonary/Chest: Effort normal and breath sounds normal.  Neurological: She is alert. Gait normal.  Skin: Skin is warm and dry. She is not diaphoretic.  Psychiatric:  Mood anxious/depressed, affect mildly anxious   EKG: Normal sinus rhythm, rate 75, no ST or T-wave changes  Assessment/Plan: Please see individual problem list.  Depression with anxiety Worsened recently. Recently started back on Celexa after being off for several weeks. She will continue with Celexa. I discussed addition of another medicine such as Klonopin for short-term use though she wanted to hold off on this at this time. She'll call us back and let us know if she needs this in the short-term until the Celexa is fully effective. She'll  continue to monitor. Given return precautions.  Perimenopause Patient has not had a menstrual cycle in the last 3 months. I offered a urine pregnancy test though she declined. She'll continue to monitor and when she gets to 12 months without a menstrual cycle she'll be considered postmenopausal.  Palpitations I suspect this is related to anxiety given recent worsening of her anxiety. EKG was reassuring. We will check lab work rule out other causes. We will treat her anxiety with Celexa. She'll monitor. She is given return precautions.   Orders Placed This Encounter  Procedures  . Comp Met (CMET)  . CBC  . TSH  . EKG 12-Lead    Tommi Rumps, MD [place]

## 2016-09-15 NOTE — Assessment & Plan Note (Addendum)
I suspect this is related to anxiety given recent worsening of her anxiety. EKG was reassuring. We will check lab work rule out other causes. We will treat her anxiety with Celexa. She'll monitor. She is given return precautions.

## 2016-09-15 NOTE — Patient Instructions (Signed)
Nice to meet you. Your symptoms are likely related to anxiety. You should continue the Celexa. We will obtain some lab work to rule out other causes. If you develop persistent palpitations, or develop chest pain, thoughts of harming herself, or any new or changing symptoms please seek medical attention.

## 2016-09-16 ENCOUNTER — Telehealth: Payer: Self-pay | Admitting: *Deleted

## 2016-09-16 LAB — COMPREHENSIVE METABOLIC PANEL
ALBUMIN: 4.8 g/dL (ref 3.5–5.5)
ALT: 31 IU/L (ref 0–32)
AST: 35 IU/L (ref 0–40)
Albumin/Globulin Ratio: 1.7 (ref 1.2–2.2)
Alkaline Phosphatase: 57 IU/L (ref 39–117)
BILIRUBIN TOTAL: 0.5 mg/dL (ref 0.0–1.2)
BUN / CREAT RATIO: 14 (ref 9–23)
BUN: 10 mg/dL (ref 6–24)
CHLORIDE: 94 mmol/L — AB (ref 96–106)
CO2: 28 mmol/L (ref 18–29)
CREATININE: 0.71 mg/dL (ref 0.57–1.00)
Calcium: 10.1 mg/dL (ref 8.7–10.2)
GFR calc non Af Amer: 100 mL/min/{1.73_m2} (ref >59)
GFR, EST AFRICAN AMERICAN: 115 mL/min/{1.73_m2} (ref >59)
GLUCOSE: 82 mg/dL (ref 65–99)
Globulin, Total: 2.8 g/dL (ref 1.5–4.5)
Potassium: 3.9 mmol/L (ref 3.5–5.2)
Sodium: 139 mmol/L (ref 134–144)
TOTAL PROTEIN: 7.6 g/dL (ref 6.0–8.5)

## 2016-09-16 LAB — TSH: TSH: 0.596 u[IU]/mL (ref 0.450–4.500)

## 2016-09-16 LAB — CBC
HEMATOCRIT: 42.1 % (ref 34.0–46.6)
HEMOGLOBIN: 14.4 g/dL (ref 11.1–15.9)
MCH: 29.3 pg (ref 26.6–33.0)
MCHC: 34.2 g/dL (ref 31.5–35.7)
MCV: 86 fL (ref 79–97)
Platelets: 323 10*3/uL (ref 150–379)
RBC: 4.91 x10E6/uL (ref 3.77–5.28)
RDW: 12.8 % (ref 12.3–15.4)
WBC: 7.2 10*3/uL (ref 3.4–10.8)

## 2016-09-16 NOTE — Telephone Encounter (Signed)
Patient requested lab results  Pt contact [phone]

## 2016-09-16 NOTE — Telephone Encounter (Signed)
Please call pt with results

## 2016-09-17 NOTE — Telephone Encounter (Signed)
Patient advised of results.

## 2016-09-20 ENCOUNTER — Telehealth: Payer: Self-pay | Admitting: Family Medicine

## 2016-09-20 NOTE — Telephone Encounter (Signed)
Called patient to inform her regarding the medication. Pt stated that Dr. Caryl Bis said to call back if medication has not kicked in yet and that he would prescribe something else until it does kick in.Pt stated that Dr. Caryl Bis noted this in her chart.  Call pt @ [phone]

## 2016-09-20 NOTE — Telephone Encounter (Signed)
90-day supply was sent to [facility] on 09/07/16. Pt will need to schedule appt to get anymore refills. She will need to call [facility] to check on RX.

## 2016-09-20 NOTE — Telephone Encounter (Signed)
Pt called asking for a refill on citalopram (CELEXA) 20 MG tablet. Pt saw Dr. Caryl Bis on 9/27 and told to call if she needs a new refill. She states that she is still having issues sleeping, and still having a bit of anxiety.   Pharmacy - [facility] [place], [place]  Call pt @ [phone]

## 2016-09-20 NOTE — Telephone Encounter (Signed)
LVTCB

## 2016-09-20 NOTE — Telephone Encounter (Signed)
Sent to wrong person.

## 2016-09-20 NOTE — Telephone Encounter (Signed)
We can offer the patient Klonopin for a short course to help with her anxiety until the increased dose of Celexa proves beneficial. Please see if she is willing to take this medication. Thanks.

## 2016-09-21 MED ORDER — CLONAZEPAM 0.5 MG PO TABS: 0.5000 mg | ORAL_TABLET | Freq: Two times a day (BID) | ORAL | 0 refills | Status: DC | PRN

## 2016-09-21 NOTE — Telephone Encounter (Signed)
Prescription printed. Please fax.

## 2016-09-21 NOTE — Telephone Encounter (Signed)
Secondary voicemail.

## 2016-09-21 NOTE — Telephone Encounter (Signed)
Faxed locally due to not being 90-days.

## 2016-09-21 NOTE — Telephone Encounter (Signed)
Pt stated that she will be willing to take the Klonopin  Pt contact [phone]

## 2016-10-26 ENCOUNTER — Other Ambulatory Visit: Payer: Self-pay | Admitting: Family Medicine

## 2017-03-10 ENCOUNTER — Encounter: Payer: 59 | Admitting: Family Medicine

## 2017-03-25 ENCOUNTER — Encounter: Payer: 59 | Admitting: Family Medicine

## 2017-04-12 ENCOUNTER — Encounter: Payer: 59 | Admitting: Family Medicine

## 2017-05-13 ENCOUNTER — Encounter: Payer: 59 | Admitting: Family Medicine

## 2017-06-06 ENCOUNTER — Ambulatory Visit (INDEPENDENT_AMBULATORY_CARE_PROVIDER_SITE_OTHER): Payer: 59 | Admitting: Family Medicine

## 2017-06-06 ENCOUNTER — Encounter: Payer: Self-pay | Admitting: Family Medicine

## 2017-06-06 VITALS — BP 120/86 | HR 72 | Temp 98.6°F | Resp 12 | Ht 64.5 in | Wt 172.0 lb

## 2017-06-06 DIAGNOSIS — Z0000 Encounter for general adult medical examination without abnormal findings: Secondary | ICD-10-CM | POA: Diagnosis not present

## 2017-06-06 MED ORDER — CLONAZEPAM 0.5 MG PO TABS: 0.5000 mg | ORAL_TABLET | Freq: Two times a day (BID) | ORAL | 0 refills | Status: DC | PRN

## 2017-06-06 MED ORDER — CITALOPRAM HYDROBROMIDE 20 MG PO TABS: 20.0000 mg | ORAL_TABLET | Freq: Every day | ORAL | 3 refills | Status: DC

## 2017-06-06 NOTE — Progress Notes (Signed)
Pre-visit discussion using our clinic review tool. No additional management support is needed unless otherwise documented below in the visit note.  

## 2017-06-06 NOTE — Patient Instructions (Signed)
Medications as prescribed.  Follow up annually.  Take care  Dr. Lacinda Axon   Health Maintenance, Female Adopting a healthy lifestyle and getting preventive care can go a long way to promote health and wellness. Talk with your health care provider about what schedule of regular examinations is right for you. This is a good chance for you to check in with your provider about disease prevention and staying healthy. In between checkups, there are plenty of things you can do on your own. Experts have done a lot of research about which lifestyle changes and preventive measures are most likely to keep you healthy. Ask your health care provider for more information. Weight and diet Eat a healthy diet  Be sure to include plenty of vegetables, fruits, low-fat dairy products, and lean protein.  Do not eat a lot of foods high in solid fats, added sugars, or salt.  Get regular exercise. This is one of the most important things you can do for your health. ? Most adults should exercise for at least 150 minutes each week. The exercise should increase your heart rate and make you sweat (moderate-intensity exercise). ? Most adults should also do strengthening exercises at least twice a week. This is in addition to the moderate-intensity exercise.  Maintain a healthy weight  Body mass index (BMI) is a measurement that can be used to identify possible weight problems. It estimates body fat based on height and weight. Your health care provider can help determine your BMI and help you achieve or maintain a healthy weight.  For females 62 years of age and older: ? A BMI below 18.5 is considered underweight. ? A BMI of 18.5 to 24.9 is normal. ? A BMI of 25 to 29.9 is considered overweight. ? A BMI of 30 and above is considered obese.  Watch levels of cholesterol and blood lipids  You should start having your blood tested for lipids and cholesterol at 51 years of age, then have this test every 5 years.  You may  need to have your cholesterol levels checked more often if: ? Your lipid or cholesterol levels are high. ? You are older than 51 years of age. ? You are at high risk for heart disease.  Cancer screening Lung Cancer  Lung cancer screening is recommended for adults 46-21 years old who are at high risk for lung cancer because of a history of smoking.  A yearly low-dose CT scan of the lungs is recommended for people who: ? Currently smoke. ? Have quit within the past 15 years. ? Have at least a 30-pack-year history of smoking. A pack year is smoking an average of one pack of cigarettes a day for 1 year.  Yearly screening should continue until it has been 15 years since you quit.  Yearly screening should stop if you develop a health problem that would prevent you from having lung cancer treatment.  Breast Cancer  Practice breast self-awareness. This means understanding how your breasts normally appear and feel.  It also means doing regular breast self-exams. Let your health care provider know about any changes, no matter how small.  If you are in your 20s or 30s, you should have a clinical breast exam (CBE) by a health care provider every 1-3 years as part of a regular health exam.  If you are 60 or older, have a CBE every year. Also consider having a breast X-ray (mammogram) every year.  If you have a family history of breast cancer, talk  to your health care provider about genetic screening.  If you are at high risk for breast cancer, talk to your health care provider about having an MRI and a mammogram every year.  Breast cancer gene (BRCA) assessment is recommended for women who have family members with BRCA-related cancers. BRCA-related cancers include: ? Breast. ? Ovarian. ? Tubal. ? Peritoneal cancers.  Results of the assessment will determine the need for genetic counseling and BRCA1 and BRCA2 testing.  Cervical Cancer Your health care provider may recommend that you be  screened regularly for cancer of the pelvic organs (ovaries, uterus, and vagina). This screening involves a pelvic examination, including checking for microscopic changes to the surface of your cervix (Pap test). You may be encouraged to have this screening done every 3 years, beginning at age 2.  For women ages 41-65, health care providers may recommend pelvic exams and Pap testing every 3 years, or they may recommend the Pap and pelvic exam, combined with testing for human papilloma virus (HPV), every 5 years. Some types of HPV increase your risk of cervical cancer. Testing for HPV may also be done on women of any age with unclear Pap test results.  Other health care providers may not recommend any screening for nonpregnant women who are considered low risk for pelvic cancer and who do not have symptoms. Ask your health care provider if a screening pelvic exam is right for you.  If you have had past treatment for cervical cancer or a condition that could lead to cancer, you need Pap tests and screening for cancer for at least 20 years after your treatment. If Pap tests have been discontinued, your risk factors (such as having a new sexual partner) need to be reassessed to determine if screening should resume. Some women have medical problems that increase the chance of getting cervical cancer. In these cases, your health care provider may recommend more frequent screening and Pap tests.  Colorectal Cancer  This type of cancer can be detected and often prevented.  Routine colorectal cancer screening usually begins at 51 years of age and continues through 51 years of age.  Your health care provider may recommend screening at an earlier age if you have risk factors for colon cancer.  Your health care provider may also recommend using home test kits to check for hidden blood in the stool.  A small camera at the end of a tube can be used to examine your colon directly (sigmoidoscopy or colonoscopy).  This is done to check for the earliest forms of colorectal cancer.  Routine screening usually begins at age 92.  Direct examination of the colon should be repeated every 5-10 years through 51 years of age. However, you may need to be screened more often if early forms of precancerous polyps or small growths are found.  Skin Cancer  Check your skin from head to toe regularly.  Tell your health care provider about any new moles or changes in moles, especially if there is a change in a mole's shape or color.  Also tell your health care provider if you have a mole that is larger than the size of a pencil eraser.  Always use sunscreen. Apply sunscreen liberally and repeatedly throughout the day.  Protect yourself by wearing long sleeves, pants, a wide-brimmed hat, and sunglasses whenever you are outside.  Heart disease, diabetes, and high blood pressure  High blood pressure causes heart disease and increases the risk of stroke. High blood pressure is more  likely to develop in: ? People who have blood pressure in the high end of the normal range (130-139/85-89 mm Hg). ? People who are overweight or obese. ? People who are African American.  If you are 36-78 years of age, have your blood pressure checked every 3-5 years. If you are 46 years of age or older, have your blood pressure checked every year. You should have your blood pressure measured twice-once when you are at a hospital or clinic, and once when you are not at a hospital or clinic. Record the average of the two measurements. To check your blood pressure when you are not at a hospital or clinic, you can use: ? An automated blood pressure machine at a pharmacy. ? A home blood pressure monitor.  If you are between 20 years and 57 years old, ask your health care provider if you should take aspirin to prevent strokes.  Have regular diabetes screenings. This involves taking a blood sample to check your fasting blood sugar level. ? If  you are at a normal weight and have a low risk for diabetes, have this test once every three years after 51 years of age. ? If you are overweight and have a high risk for diabetes, consider being tested at a younger age or more often. Preventing infection Hepatitis B  If you have a higher risk for hepatitis B, you should be screened for this virus. You are considered at high risk for hepatitis B if: ? You were born in a country where hepatitis B is common. Ask your health care provider which countries are considered high risk. ? Your parents were born in a high-risk country, and you have not been immunized against hepatitis B (hepatitis B vaccine). ? You have HIV or AIDS. ? You use needles to inject street drugs. ? You live with someone who has hepatitis B. ? You have had sex with someone who has hepatitis B. ? You get hemodialysis treatment. ? You take certain medicines for conditions, including cancer, organ transplantation, and autoimmune conditions.  Hepatitis C  Blood testing is recommended for: ? Everyone born from [birth year] through 1965. ? Anyone with known risk factors for hepatitis C.  Sexually transmitted infections (STIs)  You should be screened for sexually transmitted infections (STIs) including gonorrhea and chlamydia if: ? You are sexually active and are younger than 51 years of age. ? You are older than 51 years of age and your health care provider tells you that you are at risk for this type of infection. ? Your sexual activity has changed since you were last screened and you are at an increased risk for chlamydia or gonorrhea. Ask your health care provider if you are at risk.  If you do not have HIV, but are at risk, it may be recommended that you take a prescription medicine daily to prevent HIV infection. This is called pre-exposure prophylaxis (PrEP). You are considered at risk if: ? You are sexually active and do not regularly use condoms or know the HIV status of your  partner(s). ? You take drugs by injection. ? You are sexually active with a partner who has HIV.  Talk with your health care provider about whether you are at high risk of being infected with HIV. If you choose to begin PrEP, you should first be tested for HIV. You should then be tested every 3 months for as long as you are taking PrEP. Pregnancy  If you are premenopausal and you may  become pregnant, ask your health care provider about preconception counseling.  If you may become pregnant, take 400 to 800 micrograms (mcg) of folic acid every day.  If you want to prevent pregnancy, talk to your health care provider about birth control (contraception). Osteoporosis and menopause  Osteoporosis is a disease in which the bones lose minerals and strength with aging. This can result in serious bone fractures. Your risk for osteoporosis can be identified using a bone density scan.  If you are 49 years of age or older, or if you are at risk for osteoporosis and fractures, ask your health care provider if you should be screened.  Ask your health care provider whether you should take a calcium or vitamin D supplement to lower your risk for osteoporosis.  Menopause may have certain physical symptoms and risks.  Hormone replacement therapy may reduce some of these symptoms and risks. Talk to your health care provider about whether hormone replacement therapy is right for you. Follow these instructions at home:  Schedule regular health, dental, and eye exams.  Stay current with your immunizations.  Do not use any tobacco products including cigarettes, chewing tobacco, or electronic cigarettes.  If you are pregnant, do not drink alcohol.  If you are breastfeeding, limit how much and how often you drink alcohol.  Limit alcohol intake to no more than 1 drink per day for nonpregnant women. One drink equals 12 ounces of beer, 5 ounces of wine, or 1 ounces of hard liquor.  Do not use street  drugs.  Do not share needles.  Ask your health care provider for help if you need support or information about quitting drugs.  Tell your health care provider if you often feel depressed.  Tell your health care provider if you have ever been abused or do not feel safe at home. This information is not intended to replace advice given to you by your health care provider. Make sure you discuss any questions you have with your health care provider. Document Released: 06/21/2011 Document Revised: 05/13/2016 Document Reviewed: 09/09/2015 Elsevier Interactive Patient Education  [employer].

## 2017-06-07 DIAGNOSIS — Z1211 Encounter for screening for malignant neoplasm of colon: Secondary | ICD-10-CM | POA: Insufficient documentation

## 2017-06-07 DIAGNOSIS — Z0000 Encounter for general adult medical examination without abnormal findings: Secondary | ICD-10-CM | POA: Insufficient documentation

## 2017-06-07 MED ORDER — ZOSTER VAC RECOMB ADJUVANTED 50 MCG/0.5ML IM SUSR: 0.5000 mL | Freq: Once | INTRAMUSCULAR | 0 refills | Status: AC

## 2017-06-07 NOTE — Progress Notes (Signed)
Subjective:  Patient ID: Tamara Weeks, female    DOB: November 04, 1966  Age: 51 y.o. MRN: [id number]  CC: Annual exam  HPI Tamara Weeks is a 51 y.o. female presents to the clinic today for an annual exam.  Preventative Healthcare  Pap smear: Up to date.  Mammogram: Gets pap via GYN.  Colonoscopy: In need of.  Immunizations  Tetanus - Up to date.  Pneumococcal - N/A.  Zoster - Will consider.  Labs: In need of labs.  Alcohol use: See below.  Smoking/tobacco use: No.  PMH, Surgical Hx, Family Hx, Social History reviewed and updated as below.  Past Medical History:  Diagnosis Date  . Allergy   . Chicken pox   . Depression   . Migraines    Past Surgical History:  Procedure Laterality Date  . CESAREAN SECTION  1997  . TONSILLECTOMY AND ADENOIDECTOMY  1975   Family History  Problem Relation Age of Onset  . Arthritis Mother   . Arthritis Maternal Grandmother   . Hyperlipidemia Maternal Grandmother   . Diabetes Maternal Grandmother   . Stroke Maternal Grandfather   . Hyperlipidemia Maternal Grandfather   . Hyperlipidemia Paternal Grandmother   . Diabetes Paternal Grandmother   . Hyperlipidemia Paternal Grandfather    Social History  Substance Use Topics  . Smoking status: Never Smoker  . Smokeless tobacco: Never Used  . Alcohol use 2.4 oz/week    4 Standard drinks or equivalent per week   Review of Systems  Cardiovascular: Positive for palpitations.  Psychiatric/Behavioral:       Sadness, anxiety, stress.   All other systems reviewed and are negative.   Objective:   Today's Vitals: BP 120/86 (BP Location: Left Arm, Patient Position: Sitting, Cuff Size: Normal)   Pulse 72   Temp 98.6 F (37 C) (Oral)   Resp 12   Ht 5' 4.5" (1.638 m)   Wt 172 lb (78 kg)   SpO2 (!) 72%   BMI 29.07 kg/m   Physical Exam  Constitutional: She is oriented to person, place, and time. She appears well-developed and well-nourished. No distress.  HENT:    Head: Normocephalic and atraumatic.  Nose: Nose normal.  Mouth/Throat: Oropharynx is clear and moist. No oropharyngeal exudate.  Normal TM's bilaterally.   Eyes: Conjunctivae are normal. No scleral icterus.  Neck: Neck supple.  Cardiovascular: Normal rate and regular rhythm.   No murmur heard. Pulmonary/Chest: Effort normal and breath sounds normal. She has no wheezes. She has no rales.  Abdominal: Soft. She exhibits no distension. There is no tenderness. There is no rebound and no guarding.  Musculoskeletal: Normal range of motion. She exhibits no edema.  Lymphadenopathy:    She has no cervical adenopathy.  Neurological: She is alert and oriented to person, place, and time.  Skin: Skin is warm and dry. No rash noted.  Psychiatric: She has a normal mood and affect.  Vitals reviewed.  Assessment & Plan:   Problem List Items Addressed This Visit    Annual physical exam - Primary    Pap smear up to date. Mammogram later this year via GYN. Placing referral for colonoscopy. Tetanus up-to-date. We'll consider shingles vaccine. Labs order (she gets via [facility]).      Relevant Orders   CBC   Hemoglobin A1c   Comprehensive metabolic panel   Lipid panel   TSH      Meds ordered this encounter  Medications  . metroNIDAZOLE (METROGEL) 1 % gel    Sig:  APPLY 1 APPLICATION TOPICALLY TO AFFECTED AREA ON FACE NIGHTLY    Refill:  1  . citalopram (CELEXA) 20 MG tablet    Sig: Take 1 tablet (20 mg total) by mouth daily.    Dispense:  90 tablet    Refill:  3  . clonazePAM (KLONOPIN) 0.5 MG tablet    Sig: Take 1 tablet (0.5 mg total) by mouth 2 (two) times daily as needed for anxiety.    Dispense:  20 tablet    Refill:  0  . Zoster Vac Recomb Adjuvanted (SHINGRIX) injection    Sig: Inject 0.5 mLs into the muscle once.    Dispense:  0.5 mL    Refill:  0    Please administer second vaccine at appropriate interval.    Follow-up: Annually  [place]

## 2017-06-07 NOTE — Assessment & Plan Note (Signed)
Pap smear up to date. Mammogram later this year via GYN. Placing referral for colonoscopy. Tetanus up-to-date. We'll consider shingles vaccine. Labs order (she gets via [place]).

## 2017-07-06 ENCOUNTER — Ambulatory Visit: Payer: Self-pay | Admitting: Certified Nurse Midwife

## 2017-08-11 NOTE — Progress Notes (Deleted)
Gynecology Annual Exam  PCP: Coral Spikes, DO  Chief Complaint: No chief complaint on file.   History of Present Illness: Tamara Weeks is a 51 y.o. G1P1001 presents for annual exam. The patient {Blank single:19197::"has no complaints today.","complains of ***"}  Her menses are regular, they occur every month, and they last *** days. Her flow is {Blank single:19197::"light","heavy","moderate"}. She {Blank single:19197::"does","does not"} have intermenstrual bleeding. Her last menstrual period was ***. She denies dysmenorrhea. Last pap smear: ***, results were ***   The patient {Blank single:19197::"has never been","is not currently","is not","is"}  sexually active. She currently uses *** for contraception. She {Blank single:19197::"has","does not have"} dyspareunia.  {Blank single:19197::"Since her last visit, she has ***","Since her last visit, she has had no significant changes in her health."}  Her past medical history is remarkable for ***  The patient {Blank single:19197::"does not know how to","does not","does"} perform self breast exams. Her last mammogram was ***, results were ***.   {Blank single:19197::"There is a family history of breast cancer in her ***","There is no family history of breast cancer."} Genetic testing {Blank single:19197::"has","has not"} been done.   {Blank single:19197::"There is a family history of ovarian cancer in her ***","There is no family history of ovarian cancer."} Genetic testing {Blank single:19197::"has","has not"} been done.  The patient {Blank single:19197::"reports smoking. She smokes *** packs per day.","denies smoking."}  She {Blank single:19197::"denies drinking.","reports drinking alcohol. She reports have *** drinks per week."}   She {Blank single:19197::"reports illegal drug use. She uses ***","denies illegal drug use."}  The patient {Blank single:19197::"does not exercise","reports exercising occasionally","reports  exercising regularly"}.  The patient {Blank single:19197::"reports","denies"} current symptoms of depression.    Review of Systems: Review of Systems  Constitutional: Negative for chills, fever and weight loss.  HENT: Negative for congestion, sinus pain and sore throat.   Eyes: Negative for blurred vision and pain.  Respiratory: Negative for hemoptysis, shortness of breath and wheezing.   Cardiovascular: Negative for chest pain, palpitations and leg swelling.  Gastrointestinal: Negative for abdominal pain, blood in stool, diarrhea, heartburn, nausea and vomiting.  Genitourinary: Negative for dysuria, frequency, hematuria and urgency.  Musculoskeletal: Negative for back pain, joint pain and myalgias.  Skin: Negative for itching and rash.  Neurological: Negative for dizziness, tingling and headaches.  Endo/Heme/Allergies: Negative for environmental allergies and polydipsia. Does not bruise/bleed easily.       Negative for hirsutism   Psychiatric/Behavioral: Negative for depression. The patient is not nervous/anxious and does not have insomnia.     Past Medical History:  Past Medical History:  Diagnosis Date  . Allergy   . Chicken pox   . Depression   . Gestational diabetes   . Hyperlipidemia   . Migraines   . Mononucleosis     Past Surgical History:  Past Surgical History:  Procedure Laterality Date  . CESAREAN SECTION  1997  . TONSILLECTOMY AND ADENOIDECTOMY  1975    Family History:  Family History  Problem Relation Age of Onset  . Arthritis Mother   . Hypertension Mother   . Arthritis Maternal Grandmother   . Hyperlipidemia Maternal Grandmother   . Diabetes Maternal Grandmother   . Hypertension Maternal Grandmother   . Stroke Maternal Grandfather   . Hyperlipidemia Maternal Grandfather   . Hyperlipidemia Paternal Grandmother   . Diabetes Paternal Grandmother   . Hypertension Paternal Grandmother   . Hyperlipidemia Paternal Grandfather   . Diabetes Father   .  Diabetes Maternal Aunt   . Heart disease Maternal  Aunt   . Hypertension Maternal Aunt     Social History:  Social History   Social History  . Marital status: Married    Spouse name: N/A  . Number of children: N/A  . Years of education: N/A   Occupational History  . Not on file.   Social History Main Topics  . Smoking status: Never Smoker  . Smokeless tobacco: Never Used  . Alcohol use 2.4 oz/week    4 Standard drinks or equivalent per week  . Drug use: No  . Sexual activity: No   Other Topics Concern  . Not on file   Social History Narrative  . No narrative on file    Allergies:  Allergies  Allergen Reactions  . Penicillins     Medications: Prior to Admission medications   Medication Sig Start Date End Date Taking? Authorizing Provider  citalopram (CELEXA) 20 MG tablet Take 1 tablet (20 mg total) by mouth daily. 06/06/17   Coral Spikes, DO  clonazePAM (KLONOPIN) 0.5 MG tablet Take 1 tablet (0.5 mg total) by mouth 2 (two) times daily as needed for anxiety. 06/06/17   Coral Spikes, DO  fluticasone (FLONASE) 50 MCG/ACT nasal spray Place 2 sprays into both nostrils daily.    [provider]  metroNIDAZOLE (METROGEL) 1 % gel APPLY 1 APPLICATION TOPICALLY TO AFFECTED AREA ON FACE NIGHTLY 04/29/17   [provider]    Physical Exam Vitals: There were no vitals taken for this visit.  General: NAD HEENT: normocephalic, anicteric Neck: no thyroid enlargement, no palpable nodules, no cervical lymphadenopathy  Pulmonary: No increased work of breathing, CTAB Cardiovascular: RRR, {Blank single:19197::"with murmur","without murmur"}  Breast: Breast symmetrical, no tenderness, no palpable nodules or masses, no skin or nipple retraction present, no nipple discharge.  No axillary, infraclavicular or supraclavicular lymphadenopathy. Abdomen: Soft, non-tender, non-distended.  Umbilicus without lesions.  No hepatomegaly or masses palpable. No evidence of  hernia. Genitourinary:  External: Normal external female genitalia.  Normal urethral meatus, normal  Bartholin's and Skene's glands.    Vagina: Normal vaginal mucosa, no evidence of prolapse.    Cervix: Grossly normal in appearance, no bleeding, non-tender  Uterus: Anteverted, normal size, shape, and consistency, mobile, and non-tender  Adnexa: No adnexal masses, non-tender  Rectal: deferred  Lymphatic: no evidence of inguinal lymphadenopathy Extremities: no edema, erythema, or tenderness Neurologic: Grossly intact Psychiatric: mood appropriate, affect full     Assessment: 51 y.o. G1P1001 No problem-specific Assessment & Plan notes found for this encounter.   Plan:  ***  1) Breast cancer screening - recommend monthly self breast exam. {Blank single:19197::" ","Mammogram is up to date.","Mammogram was ordered today."}  2) STI screening was offered and {Blank single:19197::"accepted","declined"}.  3) Cervical cancer screening - {Blank single:19197::"Pap smear due in *** years","Pap not indicated","Pap was done"}. ASCCP guidelines and rational discussed.  Patient opts for {Blank single:19197::"every 5 years","every 3 years","yearly"} screening interval  4) Contraception - Education given regarding options for contraception  5) Routine healthcare maintenance including cholesterol and diabetes screening {Blank single:19197::"declined","managed by PCP","ordered today"}

## 2017-08-12 ENCOUNTER — Ambulatory Visit: Payer: 59 | Admitting: Certified Nurse Midwife

## 2017-09-09 ENCOUNTER — Ambulatory Visit (INDEPENDENT_AMBULATORY_CARE_PROVIDER_SITE_OTHER): Payer: 59 | Admitting: Certified Nurse Midwife

## 2017-09-09 ENCOUNTER — Encounter: Payer: Self-pay | Admitting: Certified Nurse Midwife

## 2017-09-09 VITALS — BP 110/70 | HR 71 | Ht 65.0 in | Wt 179.0 lb

## 2017-09-09 DIAGNOSIS — Z01419 Encounter for gynecological examination (general) (routine) without abnormal findings: Secondary | ICD-10-CM

## 2017-09-09 DIAGNOSIS — Z124 Encounter for screening for malignant neoplasm of cervix: Secondary | ICD-10-CM | POA: Diagnosis not present

## 2017-09-09 NOTE — Progress Notes (Signed)
Gynecology Annual Exam  PCP: Patient, No Pcp Per  Chief Complaint:  Chief Complaint  Patient presents with  . Gynecologic Exam    History of Present Illness:Tamara Weeks is a 51 year old Caucasian/White female, G1 P1001, who presents for her annual exam. She is having problems with hot flashes and night sweats over the last year. She has been using Amberan with some relief of vasomotor symptoms.  Her menses are absent since discontinuing her BCP in March 2017 She has had no spotting.   The patient's past medical history is notable for a history of hyperlipidemia, anxiety/depression, and GDM.Marland Kitchen  Since her last annual GYN exam dated 12/17/2015, she has had no other significant changes in her health. Dr Lacinda Axon placed her back on Celexa instead of the Lexapro. She hs been under more stress at work. Dr Lacinda Axon has left the practice and she is looking for a new PCP.  She is not sexually active. She has been sexually active in the past but not currently.  Her most recent pap smear was obtained 12/17/2015  and was NIL/ neg HRHPV  Her most recent mammogram obtained on 12/17/2015 was normal.  There is no family history of breast cancer.  There is no family history of ovarian cancer.  The patient does not do monthly self breast exams. She has not had a recent colonoscopy and is eligible.  The patient does not smoke.  The patient does drink a glass of wine or a beer daily. The patient does not use illegal drugs.  The patient does not exercise.  The patient may not get adequate calcium in her diet, and  she stopped taking a calcium supplement.  She has  had a recent cholesterol screen 2017 and it was borderline as was her Hemoglobin A1C (5.8%).     Review of Systems: Review of Systems  Constitutional: Negative for chills, fever and weight loss.  HENT: Negative for congestion, sinus pain and sore throat.   Eyes: Positive for redness. Negative for blurred vision and pain.  Respiratory:  Negative for hemoptysis, shortness of breath and wheezing.   Cardiovascular: Negative for chest pain, palpitations and leg swelling.  Gastrointestinal: Negative for abdominal pain, blood in stool, diarrhea, heartburn, nausea and vomiting.  Genitourinary: Negative for dysuria, frequency, hematuria and urgency.  Musculoskeletal: Negative for back pain, joint pain and myalgias.  Skin: Negative for itching and rash.  Neurological: Negative for dizziness, tingling and headaches.  Endo/Heme/Allergies: Positive for environmental allergies. Negative for polydipsia. Does not bruise/bleed easily.       Negative for hirsutism   Psychiatric/Behavioral: Positive for depression. The patient is nervous/anxious. The patient does not have insomnia.     Past Medical History:  Past Medical History:  Diagnosis Date  . Allergy   . Anxiety   . Chicken pox   . Depression   . Gestational diabetes   . Hyperlipidemia   . Migraines   . Mononucleosis     Past Surgical History:  Past Surgical History:  Procedure Laterality Date  . CESAREAN SECTION  1997  . TONSILLECTOMY AND ADENOIDECTOMY  1975    Family History:  Family History  Problem Relation Age of Onset  . Arthritis Mother   . Hypertension Mother   . Arthritis Maternal Grandmother   . Hyperlipidemia Maternal Grandmother   . Diabetes Maternal Grandmother   . Hypertension Maternal Grandmother   . Stroke Maternal Grandfather   . Hyperlipidemia Maternal Grandfather   . Hyperlipidemia Paternal  Grandmother   . Diabetes Paternal Grandmother   . Hypertension Paternal Grandmother   . Hyperlipidemia Paternal Grandfather   . Diabetes Father 105  . Diabetes Maternal Aunt   . Heart disease Maternal Aunt   . Hypertension Maternal Aunt     Social History:  Social History   Social History  . Marital status: Married    Spouse name: N/A  . Number of children: 1  . Years of education: 71   Occupational History  . [employer]    Social History  Main Topics  . Smoking status: Never Smoker  . Smokeless tobacco: Never Used  . Alcohol use 4.2 oz/week    7 Cans of beer per week  . Drug use: No  . Sexual activity: Not Currently    Partners: Male   Other Topics Concern  . 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 Penicillins Other (See Comments)    Medications: Prior to Admission medications   Medication Sig Start Date End Date Taking? Authorizing Provider  citalopram (CELEXA) 20 MG tablet Take 1 tablet (20 mg total) by mouth daily. 06/06/17   Coral Spikes, DO  clonazePAM (KLONOPIN) 0.5 MG tablet Take 1 tablet (0.5 mg total) by mouth 2 (two) times daily as needed for anxiety. 06/06/17   Coral Spikes, DO  fluticasone (FLONASE) 50 MCG/ACT nasal spray Place 2 sprays into both nostrils daily.    [provider]  metroNIDAZOLE (METROGEL) 1 % gel APPLY 1 APPLICATION TOPICALLY TO AFFECTED AREA ON FACE NIGHTLY 04/29/17   [provider]    Physical Exam Vitals: BP 110/70   Pulse 71   Ht 5\' 5"  (1.651 m)   Wt 179 lb (81.2 kg)   LMP 03/02/2016   BMI 29.79 kg/m   General: WF in  NAD HEENT: normocephalic, anicteric Neck: no thyroid enlargement, no palpable nodules, no cervical lymphadenopathy  Pulmonary: No increased work of breathing, CTAB Cardiovascular: RRR, without murmur  Breast: Breast symmetrical, no tenderness, no palpable nodules or masses, no skin or nipple retraction present, no nipple discharge.  No axillary, infraclavicular or supraclavicular lymphadenopathy. Abdomen: Soft, non-tender, non-distended.  Umbilicus without lesions.  No hepatomegaly or masses palpable. No evidence of hernia. Genitourinary:  External: Normal external female genitalia.  Normal urethral meatus, normal Bartholin's and Skene's glands.    Vagina: Normal vaginal mucosa, some atrophic changes requiring one digit  vaginal exam   Cervix: small, stenotic, no bleeding, non-tender  Uterus: Anteverted, normal size, shape, and consistency, mobile, and non-tender  Adnexa: No adnexal masses, non-tender  Rectal: deferred  Lymphatic: no evidence of inguinal lymphadenopathy Extremities: no edema, erythema, or tenderness Neurologic: Grossly intact Psychiatric: mood appropriate, affect full     Assessment: 51 y.o. G1P1001 normal menopausal gyn exam  Plan:   1) Breast cancer screening - recommend monthly self breast exam monthly and annual mammograms. Patient to schedule a screening mammogram at [place].  2) Colon cancer screening: would like colonoscopy. To call me with name of physician she would like to be referred to.  3) Cervical cancer screening - Pap was done. ASCCP guidelines and rational discussed.  Patient opts for every other year screening interval  4) Contraception - menopausal >1 year. No longer needs contraception.  5) Routine healthcare maintenance including cholesterol and diabetes screening done 2017 and T. Chol, LDL and hemoglobin  A1C were borderline elevated. Usually managed thru PCP. Dr Lacinda Axon no longer at [facility] for new PCP. Discussed calcium and vitamin D3 requirements and encouraged to resume calcium and vitamin D3 supplement.  6) RTO 1 year and prn.  Dalia Heading, CNM

## 2017-09-09 NOTE — Patient Instructions (Signed)
Please call [facility] at [phone] to schedule a screening mammogram. Let me know where you would like to be referred for your colonoscopy Start taking your calcium and vitamin D3 supplement daily

## 2017-09-11 ENCOUNTER — Encounter: Payer: Self-pay | Admitting: Certified Nurse Midwife

## 2017-09-12 LAB — IGP,RFX APTIMA HPV ALL PTH: PAP Smear Comment: 0

## 2018-01-19 ENCOUNTER — Encounter: Payer: Self-pay | Admitting: Occupational Therapy

## 2018-01-19 ENCOUNTER — Other Ambulatory Visit: Payer: Self-pay

## 2018-01-19 ENCOUNTER — Ambulatory Visit: Payer: Managed Care, Other (non HMO) | Attending: Orthopedic Surgery | Admitting: Occupational Therapy

## 2018-01-19 DIAGNOSIS — R208 Other disturbances of skin sensation: Secondary | ICD-10-CM | POA: Diagnosis present

## 2018-01-19 DIAGNOSIS — L905 Scar conditions and fibrosis of skin: Secondary | ICD-10-CM | POA: Diagnosis present

## 2018-01-19 NOTE — Therapy (Signed)
[place] [address], [place], [address] Phone: [phone]   Fax:  [phone]  Occupational Therapy Evaluation  Patient Details  Name: Tamara Weeks MRN: [id number] Date of Birth: 01/20/1966 Referring Provider: Rudene Christians   Encounter Date: 01/19/2018  OT End of Session - 01/19/18 0102    Visit Number  1    Number of Visits  6    Date for OT Re-Evaluation  03/02/18    OT Start Time  1632    OT Stop Time  1733    OT Time Calculation (min)  61 min    Activity Tolerance  Patient tolerated treatment well    Behavior During Therapy  [facility] for tasks assessed/performed       Past Medical History:  Diagnosis Date  . Allergy   . Anxiety   . Chicken pox   . Depression   . Gestational diabetes   . Hyperlipidemia   . Migraines   . Mononucleosis     Past Surgical History:  Procedure Laterality Date  . CESAREAN SECTION  1997  . TONSILLECTOMY AND ADENOIDECTOMY  1975    There were no vitals filed for this visit.  Subjective Assessment - 01/19/18 1739    Subjective   I had my R hand done in Oct and then Dec my L hand - I had thru the years always some symptoms and wore splints but last year they really go worse - Now my L hand still bother me - the numbness or pins and needles feels worse then prior to surgery - feels more contant     Patient Stated Goals  Want to get the numbness better and be able to do my typing on computer better , pushing up with hand and carrying something heavy     Currently in Pain?  No/denies Numbness in 4th and thumb worse and more constant         [facility] OT Assessment - 01/19/18 0001      Assessment   Medical Diagnosis  L CTR    Referring Provider  Rudene Christians    Onset Date/Surgical Date  12/02/17    Hand Dominance  Right      Prior Function   Vocation  Full time employment    Leisure  Work 8-10 hrs dayl mostly on computer , did use to exercise      Strength   Right Hand  Grip (lbs)  61    Right Hand Lateral Pinch  18 lbs    Right Hand 3 Point Pinch  15 lbs    Left Hand Grip (lbs)  50    Left Hand Lateral Pinch  12 lbs    Left Hand 3 Point Pinch  11 lbs          Contrast  Done in clinic and to do at home 2-3 x day  Scar massage and carpal spreads done in clinic and pt ed on - to ask if  Husband can help  cica scar pad for night time use - ed on using   Tendon glides  10 reps   Med N glides -5 reps   Gentle AROM  Have to look at her computer setup at work - get it more ergonomically - and use padding under  Discuss computer setup  Hand out provided              OT Education - 01/19/18 1747    Education provided  Yes    Education Details  findings and HEP     Person(s) Educated  Patient    Methods  Explanation;Demonstration;Tactile cues;Verbal cues;Handout    Comprehension  Verbalized understanding;Returned demonstration       OT Short Term Goals - 01/19/18 1753      OT SHORT TERM GOAL #1   Title  Pt to be independent in HEP to decrease scartissue , improve sensation , and verbalize understanding for modification to work setup     Time  3    Period  Weeks    Status  New    Target Date  02/09/18        OT Long Term Goals - 01/19/18 1754      OT LONG TERM GOAL #1   Title  Pt demo normal sensation on all digits with Thornell Mule     Baseline  4th and thumb testing 3.61    Time  6    Period  Weeks    Status  New    Target Date  03/02/18      OT LONG TERM GOAL #2   Title  Scar tissue and numbness improve for function score on PRWHE  to improve with 10 points     Baseline  14.5/50 on PRWHE for function ,     Time  6    Period  Weeks    Status  New    Target Date  03/02/18            Plan - 01/19/18 1749    Clinical Impression Statement  Pt present at OT evaulation 6 wks s/p L CTR - pt report decrease sensation and increase numbness in 4th and thumb the worse and more constant and some in 3rd digit- 2nd the  best - on [place] pt tested 3.61 on thumb and 4th - normal sensation on 2nd and 3rd - pt show AROM WNL but scart issue thick and adhere - and tender over scar - pt can benefit from OT services     Occupational performance deficits (Please refer to evaluation for details):  IADL's;Work;Leisure    Rehab Potential  Good    OT Frequency  1x / week    OT Duration  6 weeks    OT Treatment/Interventions  Self-care/ADL training;Fluidtherapy;Contrast Bath;Ultrasound;Cryotherapy;Paraffin;Manual Therapy;Scar mobilization;Patient/family education    Plan  assess improvement in scar adhesions and sensation - modify HEP as needed     Clinical Decision Making  Limited treatment options, no task modification necessary    OT Home Exercise Plan  see pt instruction     Consulted and Agree with Plan of Care  Patient       Patient will benefit from skilled therapeutic intervention in order to improve the following deficits and impairments:  Impaired flexibility, Pain, Decreased scar mobility, Impaired sensation, Impaired UE functional use  Visit Diagnosis: Scar condition and fibrosis of skin - Plan: Ot plan of care cert/re-cert  Other disturbances of skin sensation - Plan: Ot plan of care cert/re-cert    Problem List Patient Active Problem List   Diagnosis Date Noted  . Annual physical exam 06/07/2017  . Palpitations 09/15/2016  . Depression with anxiety 12/02/2015    Rosalyn Gess OTR/L,CLT 01/19/2018, 6:01 PM  [place] [facility] [address], [place], [address] Phone: [phone]   Fax:  [phone]  Name: Tamara Weeks MRN: [id number] Date of Birth: [birth year]

## 2018-01-19 NOTE — Patient Instructions (Signed)
Contrast 2-3 x day  Scar massage and carpal spreads by husband  cica scar pad for night time use - ed on using   Tendon glides  10 reps   Med N glides -5 reps   Gentle AROM  Have to look at her computer setup at work - get it more ergonomically - and use padding under CT during typing

## 2018-01-25 ENCOUNTER — Ambulatory Visit: Payer: Managed Care, Other (non HMO) | Attending: Orthopedic Surgery | Admitting: Occupational Therapy

## 2018-01-25 DIAGNOSIS — L905 Scar conditions and fibrosis of skin: Secondary | ICD-10-CM | POA: Diagnosis present

## 2018-01-25 DIAGNOSIS — R208 Other disturbances of skin sensation: Secondary | ICD-10-CM | POA: Insufficient documentation

## 2018-01-25 NOTE — Patient Instructions (Signed)
Pt to wear wrist splint at night time because of CT symptoms worse at night time

## 2018-01-25 NOTE — Therapy (Signed)
[facility] [address], [place], [address] Phone: [phone]   Fax:  [phone]  Occupational Therapy Treatment  Patient Details  Name: Tamara Weeks MRN: [id number] Date of Birth: 10/04/1966 Referring Provider: Rudene Christians   Encounter Date: 01/25/2018  OT End of Session - 01/25/18 1252    Visit Number  2    Number of Visits  6    Date for OT Re-Evaluation  03/02/18    OT Start Time  1200    OT Stop Time  1237    OT Time Calculation (min)  37 min    Activity Tolerance  Patient tolerated treatment well    Behavior During Therapy  [facility] for tasks assessed/performed       Past Medical History:  Diagnosis Date  . Allergy   . Anxiety   . Chicken pox   . Depression   . Gestational diabetes   . Hyperlipidemia   . Migraines   . Mononucleosis     Past Surgical History:  Procedure Laterality Date  . CESAREAN SECTION  1997  . TONSILLECTOMY AND ADENOIDECTOMY  1975    There were no vitals filed for this visit.  Subjective Assessment - 01/25/18 1247    Subjective   Did okay with my exercises - my husband helped me with those stretches- the pins and needles maybe not as intense during day as it was - but at night time the worse     Patient Stated Goals  Want to get the numbness better and be able to do my typing on computer better , pushing up with hand and carrying something heavy     Currently in Pain?  No/denies                   OT Treatments/Exercises (OP) - 01/25/18 0001      Ultrasound   Ultrasound Location  CT scar    Ultrasound Parameters  CUS , 3.3MHZ, 0.8 intensity  3 min     Ultrasound Goals  Other (Comment) scar tissue      LUE Contrast Bath   Time  9 minutes    Comments  at [facility] to decrease edema and scar tissue       Scar massage done using Graston tool nr 2 on scar and volar wrist and forearm - some tightness = worse than R  Brushing and sweeping - hard area of scar  tissue on radial distal part of CT scar  Done CT spreads  And manual scar massage by OT and ed pt again on HEP for scar tissue   Review HEP for tendon glides  And MEd N glides  Needed min A   as well as composite stretch for wrist and digits extention   US done  And then ice at end of session         OT Education - 01/25/18 1252    Education Details  Splint wearing - and cont same HEP - nerve healing     Person(s) Educated  Patient    Methods  Explanation;Demonstration;Tactile cues;Verbal cues    Comprehension  Returned demonstration;Verbalized understanding       OT Short Term Goals - 01/19/18 1753      OT SHORT TERM GOAL #1   Title  Pt to be independent in HEP to decrease scartissue , improve sensation , and verbalize understanding for modification to work setup     Time  3  Period  Weeks    Status  New    Target Date  02/09/18        OT Long Term Goals - 01/19/18 1754      OT LONG TERM GOAL #1   Title  Pt demo normal sensation on all digits with Thornell Mule     Baseline  4th and thumb testing 3.61    Time  6    Period  Weeks    Status  New    Target Date  03/02/18      OT LONG TERM GOAL #2   Title  Scar tissue and numbness improve for function score on PRWHE  to improve with 10 points     Baseline  14.5/50 on PRWHE for function ,     Time  6    Period  Weeks    Status  New    Target Date  03/02/18            Plan - 01/25/18 1253    Clinical Impression Statement  Pt cont to show some scat tissue at distal radial part of CT scar - area that is thick and tender and with scar massage preduce some increase pins and needles in thumb - pins and needles in 3rdand 2nd better at end of session - did had pt start wearing splint at night  time because of pins and needles still worse at nightime time     Occupational performance deficits (Please refer to evaluation for details):  IADL's;Work;Leisure    Rehab Potential  Good    OT Frequency  1x / week     OT Duration  6 weeks    OT Treatment/Interventions  Self-care/ADL training;Fluidtherapy;Contrast Bath;Ultrasound;Cryotherapy;Paraffin;Manual Therapy;Scar mobilization;Patient/family education    Plan  assess improvement in scar adhesions and sensation - modify HEP as needed     Clinical Decision Making  Limited treatment options, no task modification necessary    OT Home Exercise Plan  see pt instruction     Consulted and Agree with Plan of Care  Patient       Patient will benefit from skilled therapeutic intervention in order to improve the following deficits and impairments:  Impaired flexibility, Pain, Decreased scar mobility, Impaired sensation, Impaired UE functional use  Visit Diagnosis: Scar condition and fibrosis of skin  Other disturbances of skin sensation    Problem List Patient Active Problem List   Diagnosis Date Noted  . Annual physical exam 06/07/2017  . Palpitations 09/15/2016  . Depression with anxiety 12/02/2015    Rosalyn Gess OTR/L,CLT 01/25/2018, 12:56 PM  [place] [address] [place], [address] Phone: [phone]   Fax:  [phone]  Name: Tamara Weeks MRN: [id number] Date of Birth: February 10, 1966

## 2018-01-26 ENCOUNTER — Encounter: Payer: 59 | Admitting: Occupational Therapy

## 2018-02-01 ENCOUNTER — Ambulatory Visit: Payer: Managed Care, Other (non HMO) | Admitting: Occupational Therapy

## 2018-02-01 DIAGNOSIS — L905 Scar conditions and fibrosis of skin: Secondary | ICD-10-CM

## 2018-02-01 DIAGNOSIS — R208 Other disturbances of skin sensation: Secondary | ICD-10-CM

## 2018-02-01 NOTE — Patient Instructions (Signed)
Same HEP  

## 2018-02-01 NOTE — Therapy (Signed)
[place] [facility] [address] [place], [address] Phone: [phone]   Fax:  [phone]  Occupational Therapy Treatment  Patient Details  Name: Tamara Weeks MRN: [id number] Date of Birth: 1966-12-02 Referring Provider: Rudene Christians   Encounter Date: 02/01/2018  OT End of Session - 02/01/18 2135    Visit Number  3    Date for OT Re-Evaluation  03/02/18    OT Start Time  1120    OT Stop Time  1154    OT Time Calculation (min)  34 min    Activity Tolerance  Patient tolerated treatment well    Behavior During Therapy  [facility] for tasks assessed/performed       Past Medical History:  Diagnosis Date  . Allergy   . Anxiety   . Chicken pox   . Depression   . Gestational diabetes   . Hyperlipidemia   . Migraines   . Mononucleosis     Past Surgical History:  Procedure Laterality Date  . CESAREAN SECTION  1997  . TONSILLECTOMY AND ADENOIDECTOMY  1975    There were no vitals filed for this visit.  Subjective Assessment - 02/01/18 2130    Subjective   I think the numbness not as intense in my index and middle finger - thumb pins and needles also better - I would say - the scar still has this one spot that is hard and tender     Patient Stated Goals  Want to get the numbness better and be able to do my typing on computer better , pushing up with hand and carrying something heavy     Currently in Pain?  No/denies                   OT Treatments/Exercises (OP) - 02/01/18 0001      Ultrasound   Ultrasound Location  CT    Ultrasound Parameters  [place] at 20%, 3.3MHZ,  0.8 intensity     Ultrasound Goals  Edema      LUE Contrast Bath   Time  9 minutes    Comments  at [facility] to decrease scar tissue        Scar massage done using Graston tool nr 2 on scar and volar wrist and forearm - some tightness = worse than R  Brushing and sweeping - hard area of scar tissue on radial distal part of CT scar  Done CT  spreads  And manual scar massage by OT and ed pt again on HEP for scar tissue  Cont this date also vibration on distal radial side of scar  To cont with tendon glides  And MEd N glides     US done           OT Education - 02/01/18 2135    Education provided  Yes    Education Details  scar massage     Person(s) Educated  Patient    Methods  Explanation;Demonstration    Comprehension  Verbalized understanding;Returned demonstration       OT Short Term Goals - 01/19/18 1753      OT SHORT TERM GOAL #1   Title  Pt to be independent in HEP to decrease scartissue , improve sensation , and verbalize understanding for modification to work setup     Time  3    Period  Weeks    Status  New    Target Date  02/09/18  OT Long Term Goals - 01/19/18 1754      OT LONG TERM GOAL #1   Title  Pt demo normal sensation on all digits with Thornell Mule     Baseline  4th and thumb testing 3.61    Time  6    Period  Weeks    Status  New    Target Date  03/02/18      OT LONG TERM GOAL #2   Title  Scar tissue and numbness improve for function score on PRWHE  to improve with 10 points     Baseline  14.5/50 on PRWHE for function ,     Time  6    Period  Weeks    Status  New    Target Date  03/02/18            Plan - 02/01/18 2136    Clinical Impression Statement  Pt cont to show increase scar tissue on distal radial side of CT - scar tissue improve proximal and pt's report improvement in sensation deficits -  cont to do scar massage, tendon and Ulnar N glides     Occupational performance deficits (Please refer to evaluation for details):  IADL's;Work;Leisure    Rehab Potential  Good    OT Frequency  1x / week    OT Duration  4 weeks    OT Treatment/Interventions  Self-care/ADL training;Fluidtherapy;Contrast Bath;Ultrasound;Cryotherapy;Paraffin;Manual Therapy;Scar mobilization;Patient/family education    Plan  assess scar and sensation improvement     Clinical  Decision Making  Limited treatment options, no task modification necessary    OT Home Exercise Plan  see pt instruction     Consulted and Agree with Plan of Care  Patient       Patient will benefit from skilled therapeutic intervention in order to improve the following deficits and impairments:  Impaired flexibility, Pain, Decreased scar mobility, Impaired sensation, Impaired UE functional use  Visit Diagnosis: Scar condition and fibrosis of skin  Other disturbances of skin sensation    Problem List Patient Active Problem List   Diagnosis Date Noted  . Annual physical exam 06/07/2017  . Palpitations 09/15/2016  . Depression with anxiety 12/02/2015    Christena Deem, CLT 02/01/2018, 9:39 PM  [facility] [address] [place], [address] Phone: [phone]   Fax:  [phone]  Name: MESHELL ABDULAZIZ MRN: [id number] Date of Birth: 23-Jan-1966

## 2018-02-10 ENCOUNTER — Ambulatory Visit: Payer: Managed Care, Other (non HMO) | Admitting: Occupational Therapy

## 2018-02-10 DIAGNOSIS — R208 Other disturbances of skin sensation: Secondary | ICD-10-CM

## 2018-02-10 DIAGNOSIS — L905 Scar conditions and fibrosis of skin: Secondary | ICD-10-CM | POA: Diagnosis not present

## 2018-02-10 NOTE — Therapy (Signed)
[facility] [address] [place], [address] Phone: [phone]   Fax:  [phone]  Occupational Therapy Treatment  Patient Details  Name: Tamara Weeks MRN: [id number] Date of Birth: 02/04/1966 Referring Provider: Rudene Christians   Encounter Date: 02/10/2018  OT End of Session - 02/10/18 1249    Visit Number  4    Number of Visits  6    Date for OT Re-Evaluation  03/02/18    OT Start Time  1150    OT Stop Time  1230    OT Time Calculation (min)  40 min    Activity Tolerance  Patient tolerated treatment well    Behavior During Therapy  [facility] for tasks assessed/performed       Past Medical History:  Diagnosis Date  . Allergy   . Anxiety   . Chicken pox   . Depression   . Gestational diabetes   . Hyperlipidemia   . Migraines   . Mononucleosis     Past Surgical History:  Procedure Laterality Date  . CESAREAN SECTION  1997  . TONSILLECTOMY AND ADENOIDECTOMY  1975    There were no vitals filed for this visit.  Subjective Assessment - 02/10/18 1246    Subjective   I work that scar - I think the sensation is getting better - little more burning in my thumb if using it a lot or typing - still numb feeling on side of ring finger     Patient Stated Goals  Want to get the numbness better and be able to do my typing on computer better , pushing up with hand and carrying something heavy     Currently in Pain?  No/denies                   OT Treatments/Exercises (OP) - 02/10/18 0001      Cryotherapy   Number Minutes Cryotherapy  4 Minutes    Cryotherapy Location  -- on volar CT scar end of session     Type of Cryotherapy  Ice pack      Ultrasound   Ultrasound Location  CT scar    Ultrasound Parameters  [place] 3.92mHZ, 20%, .8 intensity - 42min     Ultrasound Goals  Edema      LUE Paraffin   Number Minutes Paraffin  8 Minutes    LUE Paraffin Location  Hand;Wrist    Comments  at [facility] to decrease  scar tissue        Scar massage done using Graston tool nr 2 on scar and volar wrist and forearm - some tightness = worse than R  Brushing and sweeping - hard area of scar tissue on radial distal part of CT scar  Done CT spreads  And manual scar massage by OT and ed pt again on HEP for scar tissue - but decrease from 5 min at time to 2-3 min but 3 x day  Issued new CICa scar pad for night time and if on computer - pt is 8-9 hrs day on computer   [employer] test done - thumb improve to normal sensation - 4th medial side still 3.61   To cont with tendon glides  And MEd N glides  And add prayer stretch without pressure on CT  And flexion of 3rd - block other digits     US done  End see flowsheet  And ice        OT  Education - 02/10/18 1249    Education provided  Yes    Education Details  scar massage , prayer stretch - and progress with sensation     Person(s) Educated  Patient    Methods  Demonstration    Comprehension  Returned demonstration       OT Short Term Goals - 01/19/18 1753      OT SHORT TERM GOAL #1   Title  Pt to be independent in HEP to decrease scartissue , improve sensation , and verbalize understanding for modification to work setup     Time  3    Period  Weeks    Status  New    Target Date  02/09/18        OT Long Term Goals - 01/19/18 1754      OT LONG TERM GOAL #1   Title  Pt demo normal sensation on all digits with Thornell Mule     Baseline  4th and thumb testing 3.61    Time  6    Period  Weeks    Status  New    Target Date  03/02/18      OT LONG TERM GOAL #2   Title  Scar tissue and numbness improve for function score on PRWHE  to improve with 10 points     Baseline  14.5/50 on PRWHE for function ,     Time  6    Period  Weeks    Status  New    Target Date  03/02/18            Plan - 02/10/18 1250    Clinical Impression Statement  Pt sensation on Semmes Weinstein improve to normal sensation except medial side of  4th -is 3.61 - scar tissue improving but still one areay of fibrocic and still some edema     Occupational performance deficits (Please refer to evaluation for details):  IADL's;Work;Leisure    Rehab Potential  Good    OT Frequency  1x / week    OT Duration  4 weeks    OT Treatment/Interventions  Self-care/ADL training;Fluidtherapy;Contrast Bath;Ultrasound;Cryotherapy;Paraffin;Manual Therapy;Scar mobilization;Patient/family education    Plan  assess scar and sensation improvement     Clinical Decision Making  Limited treatment options, no task modification necessary    OT Home Exercise Plan  see pt instruction     Consulted and Agree with Plan of Care  Patient       Patient will benefit from skilled therapeutic intervention in order to improve the following deficits and impairments:  Impaired flexibility, Pain, Decreased scar mobility, Impaired sensation, Impaired UE functional use  Visit Diagnosis: Scar condition and fibrosis of skin  Other disturbances of skin sensation    Problem List Patient Active Problem List   Diagnosis Date Noted  . Annual physical exam 06/07/2017  . Palpitations 09/15/2016  . Depression with anxiety 12/02/2015    Rosalyn Gess OTR/L, CLT  02/10/2018, 12:52 PM  [facility] [address], [place], [address] Phone: [phone]   Fax:  [phone]  Name: Tamara Weeks MRN: [id number] Date of Birth: April 11, 1966

## 2018-02-10 NOTE — Patient Instructions (Signed)
Cont with same HEP  But decrease scar massage to 3 x day for 2-3 min  Not 5 min and not as hard  do more ice during day on scar   Prayer stretch add  3rd digit flexion with others block

## 2018-02-23 ENCOUNTER — Ambulatory Visit: Payer: Managed Care, Other (non HMO) | Attending: Orthopedic Surgery | Admitting: Occupational Therapy

## 2018-02-23 DIAGNOSIS — R208 Other disturbances of skin sensation: Secondary | ICD-10-CM | POA: Insufficient documentation

## 2018-02-23 DIAGNOSIS — L905 Scar conditions and fibrosis of skin: Secondary | ICD-10-CM | POA: Insufficient documentation

## 2018-02-23 NOTE — Therapy (Signed)
[facility] [address] [place], [address] Phone: [phone]   Fax:  [phone]  Occupational Therapy Treatment  Patient Details  Name: Tamara Weeks MRN: [id number] Date of Birth: [birth year] Referring Provider: Rudene Christians   Encounter Date: 02/23/2018  OT End of Session - 02/23/18 1625    Visit Number  5    Number of Visits  6    Date for OT Re-Evaluation  03/02/18    OT Start Time  1245    OT Stop Time  1320    OT Time Calculation (min)  35 min    Activity Tolerance  Patient tolerated treatment well    Behavior During Therapy  [facility] for tasks assessed/performed       Past Medical History:  Diagnosis Date  . Allergy   . Anxiety   . Chicken pox   . Depression   . Gestational diabetes   . Hyperlipidemia   . Migraines   . Mononucleosis     Past Surgical History:  Procedure Laterality Date  . CESAREAN SECTION  1997  . TONSILLECTOMY AND ADENOIDECTOMY  1975    There were no vitals filed for this visit.  Subjective Assessment - 02/23/18 1620    Subjective   Doing okay - but I think I need to get different keyboard and get gel support for my wrist - I do sleep with my splint but some nights my fingers goes numb - side of ring finger still feel funny and thumb     Patient Stated Goals  Want to get the numbness better and be able to do my typing on computer better , pushing up with hand and carrying something heavy     Currently in Pain?  No/denies                   OT Treatments/Exercises (OP) - 02/23/18 0001      Ultrasound   Ultrasound Location  CT scar     Ultrasound Parameters  [place] 3.3MHZ, 20 % , .8 intensity     Ultrasound Goals  Edema      LUE Paraffin   Number Minutes Paraffin  8 Minutes    LUE Paraffin Location  Hand;Wrist    Comments  at [facility] prior to manual          Scar massage done using Graston tool nr 2 on scar and volar wrist and forearm - some tightness  done  this date couple of areas  - xtractor with AROM fisting  Brushing and sweeping with graston nr 2 - hard area of scar tissue on radial distal part of CT scar  Done CT spreads  And manual scar massage by OT and ed pt again on HEP for scar tissue-  Issued new CICa scar pad for night time and if on computer - pt is 8-9 hrs day on computer  recommend looking into gelsupport for wrist with keyboard   Semmes Weinstein test done - thumb and medial 4th  improve to normal sensation but still not feeling the same intensity   To cont withtendon glides  And MEd N glides  - needed min A this date  Cont with prayer stretch without pressure on CT  And flexion of 3rd - block other digits  And then sleeping without splint for 3 nights and see if numbness at night decrease - or pay attention what she does prior to bed time - if on computer  OT Education - 02/23/18 1623    Education provided  Yes    Education Details  homeprogram - splint wearing and computer changes     Person(s) Educated  Patient    Methods  Explanation;Demonstration    Comprehension  Verbalized understanding       OT Short Term Goals - 01/19/18 1753      OT SHORT TERM GOAL #1   Title  Pt to be independent in HEP to decrease scartissue , improve sensation , and verbalize understanding for modification to work setup     Time  3    Period  Weeks    Status  New    Target Date  02/09/18        OT Long Term Goals - 01/19/18 1754      OT LONG TERM GOAL #1   Title  Pt demo normal sensation on all digits with Thornell Mule     Baseline  4th and thumb testing 3.61    Time  6    Period  Weeks    Status  New    Target Date  03/02/18      OT LONG TERM GOAL #2   Title  Scar tissue and numbness improve for function score on PRWHE  to improve with 10 points     Baseline  14.5/50 on PRWHE for function ,     Time  6    Period  Weeks    Status  New    Target Date  03/02/18            Plan - 02/23/18 1626     Clinical Impression Statement  Pt sensation cont to improve on Semmes Weinstein - normal tesing on all digits - but still not normal feeling on DIP of 4th , and thumb- but can locate - AROM and strength WNL - pt do report some nights still having numbness - pt to pay attention what she do prior to going to bed and maybe try 3 nights sleeping with no splint - cont with homeprogram to decrease scar tissue and  edema - change computer setup to decrease pressur on CT     Occupational performance deficits (Please refer to evaluation for details):  IADL's;Work;Leisure    Rehab Potential  Good    OT Frequency  -- 3 wks     OT Duration  -- 3 wks     OT Treatment/Interventions  Self-care/ADL training;Fluidtherapy;Contrast Bath;Ultrasound;Cryotherapy;Paraffin;Manual Therapy;Scar mobilization;Patient/family education    Clinical Decision Making  Limited treatment options, no task modification necessary    OT Home Exercise Plan  see pt instruction     Consulted and Agree with Plan of Care  Patient       Patient will benefit from skilled therapeutic intervention in order to improve the following deficits and impairments:  Impaired flexibility, Pain, Decreased scar mobility, Impaired sensation, Impaired UE functional use  Visit Diagnosis: Scar condition and fibrosis of skin  Other disturbances of skin sensation    Problem List Patient Active Problem List   Diagnosis Date Noted  . Annual physical exam 06/07/2017  . Palpitations 09/15/2016  . Depression with anxiety 12/02/2015    Rosalyn Gess OTR/L,CLT 02/23/2018, 4:29 PM  [facility] [address] [place], [address] Phone: [phone]   Fax:  [phone]  Name: ARMANDA FORAND MRN: [id number] Date of Birth: 01-23-1966

## 2018-02-23 NOTE — Patient Instructions (Signed)
Ice several times during day  Pay attention what she do prior to going to be - if on computer  Maybe try 3 nights no night splint   cont with scar mobs and scar pad   tendon glides and MEd N glide

## 2018-03-16 ENCOUNTER — Ambulatory Visit: Payer: Managed Care, Other (non HMO) | Admitting: Occupational Therapy

## 2018-03-17 ENCOUNTER — Ambulatory Visit: Payer: Managed Care, Other (non HMO) | Admitting: Occupational Therapy

## 2018-03-17 DIAGNOSIS — R208 Other disturbances of skin sensation: Secondary | ICD-10-CM

## 2018-03-17 DIAGNOSIS — L905 Scar conditions and fibrosis of skin: Secondary | ICD-10-CM

## 2018-03-17 NOTE — Therapy (Signed)
[facility] [address] [place], [address] Phone: [phone]   Fax:  [phone]  Occupational Therapy Treatment/discharge  Patient Details  Name: Tamara Weeks MRN: [id number] Date of Birth: November 30, 1966 Referring Provider: Rudene Christians   Encounter Date: 03/17/2018  OT End of Session - 03/17/18 0814    Visit Number  6    Number of Visits  6    Date for OT Re-Evaluation  03/17/18    OT Start Time  0731    OT Stop Time  0810    OT Time Calculation (min)  39 min    Activity Tolerance  Patient tolerated treatment well    Behavior During Therapy  [facility] for tasks assessed/performed       Past Medical History:  Diagnosis Date  . Allergy   . Anxiety   . Chicken pox   . Depression   . Gestational diabetes   . Hyperlipidemia   . Migraines   . Mononucleosis     Past Surgical History:  Procedure Laterality Date  . CESAREAN SECTION  1997  . TONSILLECTOMY AND ADENOIDECTOMY  1975    There were no vitals filed for this visit.  Subjective Assessment - 03/17/18 0810    Subjective   I payed attention - it does matter what I do - at night time my 3 fingers goes numb, or last night working out and my hands are over my head they feel funny - other wise my thumb pins and needles during day getting better but inside of my ring finger still feels funny - scar feels better     Patient Stated Goals  Want to get the numbness better and be able to do my typing on computer better , pushing up with hand and carrying something heavy     Currently in Pain?  No/denies         [facility] OT Assessment - 03/17/18 0001      Strength   Right Hand Grip (lbs)  61    Right Hand Lateral Pinch  18 lbs    Right Hand 3 Point Pinch  15 lbs    Left Hand Grip (lbs)  55    Left Hand Lateral Pinch  18 lbs    Left Hand 3 Point Pinch  15 lbs          Assess scar - great progress - small tender area distal - and thumb AROM  WNL  Grip  and prehension great progress      OT Treatments/Exercises (OP) - 03/17/18 0001      LUE Paraffin   Number Minutes Paraffin  8 Minutes    LUE Paraffin Location  Hand;Wrist    Comments  prior to soft tissue         Scar massage done using Graston tool nr 2 on scar and volar wrist and forearm - some tightness   Brushing and sweeping with graston nr 2 - Done CT spreads  And manual scar massage by OT and ed pt again on HEP for scar tissue-  Issued new CICa scar pad for night time and if on computer - pt is 8-9 hrs day on computer  recommend looking into gelsupport for wrist with keyboard   Semmes Weinstein test done - thumb and medial 4th  improve to normal sensation but still not feeling the same intensity 4th    Cont with prayer stretch without pressure on CT  Pt to contact  surgeon about night numbness cont and if hands over head this week working out        OT Education - 03/17/18 0813    Education provided  Yes    Education Details  pt to contact surgeon about numbness at night time, and in ring finger still     Person(s) Educated  Patient    Methods  Explanation;Demonstration;Tactile cues    Comprehension  Verbalized understanding;Returned demonstration       OT Short Term Goals - 03/17/18 0818      OT SHORT TERM GOAL #1   Title  Pt to be independent in HEP to decrease scartissue , improve sensation , and verbalize understanding for modification to work setup     Status  Achieved        OT Long Term Goals - 03/17/18 0819      OT LONG TERM GOAL #1   Title  Pt demo normal sensation on all digits with Thornell Mule     Baseline  normal sensation on [employer] - but still feel numb feeling on radial side of 4th , and numbness at night time - working out if hands over head     Status  Partially [place] #2   Title  Scar tissue and numbness improve for function score on PRWHE  to improve with 10 points     Baseline  14.5/50 on  PRWHE for function ,  - using hand normally 0/50     Status  Achieved            Plan - 03/17/18 0815    Clinical Impression Statement  Pt made great progress in strength , scar tissue and sensation improved - but cont to have some numbness at night time - and not related if she is on and off computer prior to bed time - Semmes Weinstein normal on all digits - but diminshed on radial side of 4th - thumb improve - only some pins and needles at times - but report starting to work out and if hands are in air hands feels funny - pt to contact Dr Rudene Christians for further assessment or possible ultrasound guided shot     Plan  contact Dr Rudene Christians to assess cont numbness at night time - diminished sensation radial side of 4th     Clinical Decision Making  Limited treatment options, no task modification necessary    OT Home Exercise Plan  see pt instruction     Consulted and Agree with Plan of Care  Patient       Patient will benefit from skilled therapeutic intervention in order to improve the following deficits and impairments:     Visit Diagnosis: Scar condition and fibrosis of skin  Other disturbances of skin sensation    Problem List Patient Active Problem List   Diagnosis Date Noted  . Annual physical exam 06/07/2017  . Palpitations 09/15/2016  . Depression with anxiety 12/02/2015    Rosalyn Gess OTR/L,CLT 03/17/2018, 8:30 AM  [facility] [address], [place], [address] Phone: [phone]   Fax:  [phone]  Name: Tamara Weeks MRN: [id number] Date of Birth: August 16, 1966

## 2018-03-17 NOTE — Patient Instructions (Signed)
Contact surgeon about night time numbness still

## 2018-03-29 ENCOUNTER — Ambulatory Visit: Payer: Self-pay | Admitting: Physician Assistant

## 2018-04-04 ENCOUNTER — Ambulatory Visit: Payer: Self-pay | Admitting: Physician Assistant

## 2018-04-10 ENCOUNTER — Ambulatory Visit: Payer: Self-pay | Admitting: Physician Assistant

## 2018-04-13 ENCOUNTER — Ambulatory Visit (INDEPENDENT_AMBULATORY_CARE_PROVIDER_SITE_OTHER): Payer: Managed Care, Other (non HMO) | Admitting: Physician Assistant

## 2018-04-13 ENCOUNTER — Encounter: Payer: Self-pay | Admitting: Physician Assistant

## 2018-04-13 VITALS — BP 104/72 | HR 73 | Temp 98.6°F | Resp 16 | Ht 65.0 in | Wt 173.0 lb

## 2018-04-13 DIAGNOSIS — Z130 Encounter for screening for diseases of the blood and blood-forming organs and certain disorders involving the immune mechanism: Secondary | ICD-10-CM | POA: Diagnosis not present

## 2018-04-13 DIAGNOSIS — Z1329 Encounter for screening for other suspected endocrine disorder: Secondary | ICD-10-CM

## 2018-04-13 DIAGNOSIS — F419 Anxiety disorder, unspecified: Secondary | ICD-10-CM

## 2018-04-13 DIAGNOSIS — Z131 Encounter for screening for diabetes mellitus: Secondary | ICD-10-CM

## 2018-04-13 DIAGNOSIS — E785 Hyperlipidemia, unspecified: Secondary | ICD-10-CM | POA: Diagnosis not present

## 2018-04-13 MED ORDER — CITALOPRAM HYDROBROMIDE 20 MG PO TABS: 20.0000 mg | ORAL_TABLET | Freq: Every day | ORAL | 0 refills | Status: DC

## 2018-04-13 NOTE — Progress Notes (Signed)
Patient: Tamara Weeks Female    DOB: Feb 26, 1966   52 y.o.   MRN: [id number] Visit Date: 04/14/2018  Today's Provider: Trinna Post, PA-C   Chief Complaint  Patient presents with  . Establish Care  . Anxiety   Subjective:    HPI One son age 51 In a relationship - married for 25 years, relationship going well Last tetatnus??? Cut finger and got tetanus shot within last ten years Previously on celexa - a month and half    New Patient Establishing Care:  Patient is here today to establish care. Her former PCP was Dr. Lacinda Axon at [place]. Her reason for leaving was due to her provider left the practice. 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 Married for 25 years, one son who is 41. She cut her finger and got a tetanus shot she believes within the past ten years.   Patient would like to restart Citalopram for Anxiety. She has been on this medication in the past and it worked well for her.  Also asks about arthritis of her knees and strengthening programs she can do for that.    Allergies  Allergen Reactions  . Penicillins Other (See Comments)     Current Outpatient Medications:  .  clonazePAM (KLONOPIN) 0.5 MG tablet, Take 1 tablet (0.5 mg total) by mouth 2 (two) times daily as needed for anxiety., Disp: 20 tablet, Rfl: 0 .  Multiple Vitamin (MULTIVITAMIN) tablet, Take 1 tablet by mouth daily., Disp: , Rfl:  .  olopatadine (PATANOL) 0.1 % ophthalmic solution, Apply to eye., Disp: , Rfl:  .  citalopram (CELEXA) 20 MG tablet, Take 1 tablet (20 mg total) by mouth daily., Disp: 90 tablet, Rfl: 0  Review of Systems  Constitutional: Negative for chills, fatigue and fever.  HENT: Negative for congestion, ear pain, rhinorrhea, sneezing and sore throat.   Eyes: Negative.  Negative for pain and redness.  Respiratory: Negative for cough, shortness of breath and wheezing.   Cardiovascular: Negative for chest pain and leg swelling.  Gastrointestinal:  Negative for abdominal pain, blood in stool, constipation, diarrhea and nausea.  Endocrine: Negative for polydipsia and polyphagia.  Genitourinary: Negative.  Negative for dysuria, flank pain, hematuria, pelvic pain, vaginal bleeding and vaginal discharge.  Musculoskeletal: Negative for arthralgias, back pain, gait problem and joint swelling.  Skin: Negative for rash.  Neurological: Negative.  Negative for dizziness, tremors, seizures, weakness, light-headedness, numbness and headaches.  Hematological: Negative for adenopathy.  Psychiatric/Behavioral: Negative for behavioral problems, confusion and dysphoric mood. The patient is nervous/anxious. The patient is not hyperactive.    Family History  Problem Relation Age of Onset  . Arthritis Mother   . Hypertension Mother   . Arthritis Maternal Grandmother   . Hyperlipidemia Maternal Grandmother   . Diabetes Maternal Grandmother   . Hypertension Maternal Grandmother   . Stroke Maternal Grandfather   . Hyperlipidemia Maternal Grandfather   . Hyperlipidemia Paternal Grandmother   . Diabetes Paternal Grandmother   . Hypertension Paternal Grandmother   . Hyperlipidemia Paternal Grandfather   . Diabetes Father 29  . Diabetes Maternal Aunt   . Heart disease Maternal Aunt   . Hypertension Maternal Aunt    Past Surgical History:  Procedure Laterality Date  . CESAREAN SECTION  1997  . TONSILLECTOMY AND ADENOIDECTOMY  1975     Social History   Tobacco Use  . Smoking status: Never Smoker  . Smokeless tobacco: Never Used  Substance Use Topics  .  Alcohol use: Yes    Alcohol/week: 1.2 oz    Types: 2 Cans of beer per week   Objective:   BP 104/72 (BP Location: Left Arm, Patient Position: Sitting, Cuff Size: Large)   Pulse 73   Temp 98.6 F (37 C) (Oral)   Resp 16   Ht 5\' 5"  (1.651 m)   Wt 173 lb (78.5 kg)   LMP 03/02/2016   SpO2 99% Comment: room air  BMI 28.79 kg/m  Vitals:   04/13/18 0917  BP: 104/72  Pulse: 73  Resp: 16    Temp: 98.6 F (37 C)  TempSrc: Oral  SpO2: 99%  Weight: 173 lb (78.5 kg)  Height: 5\' 5"  (1.651 m)     Physical Exam  Constitutional: She is oriented to person, place, and time. She appears well-developed and well-nourished.  Cardiovascular: Normal rate and regular rhythm.  Pulmonary/Chest: Effort normal and breath sounds normal.  Musculoskeletal: She exhibits no edema, tenderness or deformity.  Crepitus in knees bilaterally.  Neurological: She is alert and oriented to person, place, and time.  Skin: Skin is warm and dry.  Psychiatric: She has a normal mood and affect. Her behavior is normal.        Assessment & Plan:     1. Anxiety  - citalopram (CELEXA) 20 MG tablet; Take 1 tablet (20 mg total) by mouth daily.  Dispense: 90 tablet; Refill: 0  2. Diabetes mellitus screening  - Comprehensive Metabolic Panel (CMET) - HgB A1c  3. Screening for thyroid disorder  - TSH  4. Screening for deficiency anemia  - CBC with Differential  5. Hyperlipidemia, unspecified hyperlipidemia type  - Lipid Profile  Return in about 2 months (around 06/13/2018) for CPE.  The entirety of the information documented in the History of Present Illness, Review of Systems and Physical Exam were personally obtained by me. Portions of this information were initially documented by Meyer Cory, CMA and reviewed by me for thoroughness and accuracy.        Trinna Post, PA-C  [facility]

## 2018-04-13 NOTE — Patient Instructions (Signed)

## 2018-05-31 LAB — CBC WITH DIFFERENTIAL/PLATELET
Basophils Absolute: 0 10*3/uL (ref 0.0–0.2)
Basos: 1 %
EOS (ABSOLUTE): 0.1 10*3/uL (ref 0.0–0.4)
Eos: 1 %
Hematocrit: 41.5 % (ref 34.0–46.6)
Hemoglobin: 14.4 g/dL (ref 11.1–15.9)
Immature Grans (Abs): 0 10*3/uL (ref 0.0–0.1)
Immature Granulocytes: 0 %
Lymphocytes Absolute: 1.5 10*3/uL (ref 0.7–3.1)
Lymphs: 31 %
MCH: 29.2 pg (ref 26.6–33.0)
MCHC: 34.7 g/dL (ref 31.5–35.7)
MCV: 84 fL (ref 79–97)
Monocytes Absolute: 0.3 10*3/uL (ref 0.1–0.9)
Monocytes: 7 %
Neutrophils Absolute: 3 10*3/uL (ref 1.4–7.0)
Neutrophils: 60 %
Platelets: 303 10*3/uL (ref 150–450)
RBC: 4.93 x10E6/uL (ref 3.77–5.28)
RDW: 13.8 % (ref 12.3–15.4)
WBC: 5 10*3/uL (ref 3.4–10.8)

## 2018-05-31 LAB — COMPREHENSIVE METABOLIC PANEL
ALT: 10 IU/L (ref 0–32)
AST: 18 IU/L (ref 0–40)
Albumin/Globulin Ratio: 2.2 (ref 1.2–2.2)
Albumin: 4.7 g/dL (ref 3.5–5.5)
Alkaline Phosphatase: 69 IU/L (ref 39–117)
BUN/Creatinine Ratio: 16 (ref 9–23)
BUN: 10 mg/dL (ref 6–24)
Bilirubin Total: 0.5 mg/dL (ref 0.0–1.2)
CO2: 26 mmol/L (ref 20–29)
Calcium: 10.1 mg/dL (ref 8.7–10.2)
Chloride: 100 mmol/L (ref 96–106)
Creatinine, Ser: 0.63 mg/dL (ref 0.57–1.00)
GFR calc Af Amer: 119 mL/min/{1.73_m2} (ref >59)
GFR calc non Af Amer: 103 mL/min/{1.73_m2} (ref >59)
Globulin, Total: 2.1 g/dL (ref 1.5–4.5)
Glucose: 95 mg/dL (ref 65–99)
Potassium: 4.4 mmol/L (ref 3.5–5.2)
Sodium: 141 mmol/L (ref 134–144)
Total Protein: 6.8 g/dL (ref 6.0–8.5)

## 2018-05-31 LAB — LIPID PANEL
Chol/HDL Ratio: 3.1 ratio (ref 0.0–4.4)
Cholesterol, Total: 206 mg/dL — ABNORMAL HIGH (ref 100–199)
HDL: 66 mg/dL (ref >39)
LDL Calculated: 125 mg/dL — ABNORMAL HIGH (ref 0–99)
Triglycerides: 75 mg/dL (ref 0–149)
VLDL Cholesterol Cal: 15 mg/dL (ref 5–40)

## 2018-05-31 LAB — HEMOGLOBIN A1C
Est. average glucose Bld gHb Est-mCnc: 120 mg/dL
Hgb A1c MFr Bld: 5.8 % — ABNORMAL HIGH (ref 4.8–5.6)

## 2018-05-31 LAB — TSH: TSH: 0.925 u[IU]/mL (ref 0.450–4.500)

## 2018-06-02 NOTE — Progress Notes (Signed)
Advised  ED 

## 2018-06-07 ENCOUNTER — Encounter: Payer: Self-pay | Admitting: Physician Assistant

## 2018-06-07 ENCOUNTER — Ambulatory Visit (INDEPENDENT_AMBULATORY_CARE_PROVIDER_SITE_OTHER): Payer: Managed Care, Other (non HMO) | Admitting: Physician Assistant

## 2018-06-07 VITALS — BP 108/70 | HR 64 | Temp 98.5°F | Resp 16 | Ht 65.0 in | Wt 163.0 lb

## 2018-06-07 DIAGNOSIS — Z1231 Encounter for screening mammogram for malignant neoplasm of breast: Secondary | ICD-10-CM

## 2018-06-07 DIAGNOSIS — Z0000 Encounter for general adult medical examination without abnormal findings: Secondary | ICD-10-CM | POA: Diagnosis not present

## 2018-06-07 DIAGNOSIS — Z1211 Encounter for screening for malignant neoplasm of colon: Secondary | ICD-10-CM | POA: Diagnosis not present

## 2018-06-07 DIAGNOSIS — Z1239 Encounter for other screening for malignant neoplasm of breast: Secondary | ICD-10-CM

## 2018-06-07 NOTE — Patient Instructions (Signed)

## 2018-06-07 NOTE — Progress Notes (Signed)
Patient: Tamara Weeks, Female    DOB: [birth year], 52 y.o.   MRN: [id number] Visit Date: 06/21/2018  Today's Provider: Trinna Post, PA-C   Chief Complaint  Patient presents with  . Annual Exam   Subjective:    Annual physical exam Tamara Weeks is a 52 y.o. female who presents today for health maintenance and complete physical. She feels fairly well. She reports exercising regularly. She reports she is sleeping fairly well.  Pt reports she wakes up a lot at night.  Colonoscopy and mammography due.   -----------------------------------------------------------------   Review of Systems  Constitutional: Negative.   HENT: Negative.   Eyes: Negative.   Respiratory: Negative.   Cardiovascular: Negative.   Gastrointestinal: Negative.   Endocrine: Negative.   Genitourinary: Negative.   Musculoskeletal: Positive for arthralgias, back pain, neck pain and neck stiffness. Negative for gait problem, joint swelling and myalgias.  Skin: Negative.   Allergic/Immunologic: Positive for environmental allergies. Negative for food allergies and immunocompromised state.  Neurological: Negative.   Hematological: Negative.   Psychiatric/Behavioral: Negative.     Social History      She  reports that she has never smoked. She has never used smokeless tobacco. She reports that she drinks about 1.2 oz of alcohol per week. She reports that she does not use drugs.       Social History   Socioeconomic History  . Marital status: Married    Spouse name: Not on file  . Number of children: 1  . Years of education: 32  . Highest education level: Not on file  Occupational History  . Occupation: [employer]  Social Needs  . Financial resource strain: Not on file  . Food insecurity:    Worry: Not on file    Inability: Not on file  . Transportation needs:    Medical: Not on file    Non-medical: Not on file  Tobacco Use  . Smoking status: Never Smoker  . Smokeless  tobacco: Never Used  Substance and Sexual Activity  . Alcohol use: Yes    Alcohol/week: 1.2 oz    Types: 2 Cans of beer per week  . Drug use: No  . Sexual activity: Not Currently    Partners: Male  Lifestyle  . Physical activity:    Days per week: Not on file    Minutes per session: Not on file  . Stress: Not on file  Relationships  . Social connections:    Talks on phone: Not on file    Gets together: Not on file    Attends religious service: Not on file    Active member of club or organization: Not on file    Attends meetings of clubs or organizations: Not on file    Relationship status: Not on file  Other Topics Concern  . 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 Allergy   . Anxiety   . Chicken pox   . Depression   . Gestational diabetes   . Hyperlipidemia   . Migraines   . Mononucleosis      Patient Active Problem List   Diagnosis Date Noted  . Annual physical exam 06/07/2017  . Palpitations 09/15/2016  . Depression with anxiety 12/02/2015    Past Surgical History:  Procedure Laterality Date  . CARPAL TUNNEL RELEASE    .  CESAREAN SECTION  1997  . TONSILLECTOMY AND ADENOIDECTOMY  1975    Family History        Family Status  Relation Name Status  . Mother  Alive, age 51y  . MGM  Deceased  . MGF  Deceased  . PGM  Deceased  . PGF  (Not Specified)  . Father  Alive, age 15y  . Mat Aunt  (Not Specified)        Her family history includes Alzheimer's disease in her maternal grandfather; Arthritis in her maternal grandmother and mother; Depression in her mother; Diabetes in her maternal aunt, maternal grandmother, and paternal grandmother; Diabetes (age of onset: 92) in her father; Heart disease in her maternal aunt; Hyperlipidemia in her father, maternal grandfather, maternal grandmother, paternal grandfather, and paternal grandmother;  Hypertension in her maternal aunt, maternal grandmother, mother, and paternal grandmother; Stroke in her maternal grandfather.      Allergies  Allergen Reactions  . Penicillins Other (See Comments)     Current Outpatient Medications:  .  citalopram (CELEXA) 20 MG tablet, Take 1 tablet (20 mg total) by mouth daily., Disp: 90 tablet, Rfl: 0 .  clonazePAM (KLONOPIN) 0.5 MG tablet, Take 1 tablet (0.5 mg total) by mouth 2 (two) times daily as needed for anxiety., Disp: 20 tablet, Rfl: 0 .  Misc Natural Products (OSTEO BI-FLEX ADV TRIPLE ST PO), Take by mouth., Disp: , Rfl:  .  Multiple Vitamin (MULTIVITAMIN) tablet, Take 1 tablet by mouth daily., Disp: , Rfl:  .  olopatadine (PATANOL) 0.1 % ophthalmic solution, Apply to eye., Disp: , Rfl:    Patient Care Team: Paulene Floor as PCP - General (Physician Assistant)      Objective:   Vitals: BP 108/70 (BP Location: Right Arm, Patient Position: Sitting, Cuff Size: Normal)   Pulse 64   Temp 98.5 F (36.9 C) (Oral)   Resp 16   Ht 5\' 5"  (1.651 m)   Wt 163 lb (73.9 kg)   LMP 03/02/2016   BMI 27.12 kg/m    Vitals:   06/07/18 1521  BP: 108/70  Pulse: 64  Resp: 16  Temp: 98.5 F (36.9 C)  TempSrc: Oral  Weight: 163 lb (73.9 kg)  Height: 5\' 5"  (1.651 m)     Physical Exam  Constitutional: She is oriented to person, place, and time. She appears well-developed and well-nourished.  Cardiovascular: Normal rate and regular rhythm.  Pulmonary/Chest: Effort normal and breath sounds normal.  Neurological: She is alert and oriented to person, place, and time.  Skin: Skin is warm and dry.  Psychiatric: She has a normal mood and affect. Her behavior is normal.     Depression Screen PHQ 2/9 Scores 06/07/2018  PHQ - 2 Score 0  PHQ- 9 Score 1      Assessment & Plan:     Routine Health Maintenance and Physical Exam  Exercise Activities and Dietary recommendations Goals    None      Immunization History  Administered  Date(s) Administered  . Influenza Inj Mdck Quad Pf 12/31/2017  . Influenza-Unspecified 09/12/2016    Health Maintenance  Topic Date Due  . TETANUS/TDAP  02/20/1985  . COLONOSCOPY  02/21/2016  . MAMMOGRAM  12/16/2016  . INFLUENZA VACCINE  07/20/2018  . PAP SMEAR  09/09/2020     Discussed health benefits of physical activity, and encouraged her to engage in regular exercise appropriate for her age and condition.  1. Annual physical exam  Labs normal except for prediabetes  which is stable.  2. Breast cancer screening  Gave [place] self schedule card.   - MM Digital Screening; Future  3. Colon cancer screening  - Ambulatory referral to Gastroenterology  Return in about 1 year (around 06/08/2019) for CPE.  The entirety of the information documented in the History of Present Illness, Review of Systems and Physical Exam were personally obtained by me. Portions of this information were initially documented by Ashley Royalty, CMA and reviewed by me for thoroughness and accuracy.   --------------------------------------------------------------------    Trinna Post, PA-C  [facility]

## 2018-06-08 ENCOUNTER — Other Ambulatory Visit: Payer: Self-pay

## 2018-06-08 DIAGNOSIS — Z1211 Encounter for screening for malignant neoplasm of colon: Secondary | ICD-10-CM

## 2018-08-04 ENCOUNTER — Ambulatory Visit
Admission: RE | Admit: 2018-08-04 | Discharge: 2018-08-04 | Disposition: A | Discharge status: Home / Self Care | Payer: Managed Care, Other (non HMO) | Source: Ambulatory Visit | Attending: Gastroenterology | Admitting: Gastroenterology

## 2018-08-04 ENCOUNTER — Encounter: Payer: Self-pay | Admitting: *Deleted

## 2018-08-04 ENCOUNTER — Ambulatory Visit: Payer: Managed Care, Other (non HMO) | Admitting: Certified Registered Nurse Anesthetist

## 2018-08-04 ENCOUNTER — Encounter
Admission: RE | Disposition: A | Discharge status: Home / Self Care | Payer: Self-pay | Source: Ambulatory Visit | Attending: Gastroenterology

## 2018-08-04 DIAGNOSIS — D120 Benign neoplasm of cecum: Secondary | ICD-10-CM

## 2018-08-04 DIAGNOSIS — D123 Benign neoplasm of transverse colon: Secondary | ICD-10-CM | POA: Diagnosis not present

## 2018-08-04 DIAGNOSIS — I739 Peripheral vascular disease, unspecified: Secondary | ICD-10-CM | POA: Diagnosis not present

## 2018-08-04 DIAGNOSIS — F419 Anxiety disorder, unspecified: Secondary | ICD-10-CM | POA: Insufficient documentation

## 2018-08-04 DIAGNOSIS — Z79899 Other long term (current) drug therapy: Secondary | ICD-10-CM | POA: Diagnosis not present

## 2018-08-04 DIAGNOSIS — F329 Major depressive disorder, single episode, unspecified: Secondary | ICD-10-CM | POA: Insufficient documentation

## 2018-08-04 DIAGNOSIS — Z1211 Encounter for screening for malignant neoplasm of colon: Secondary | ICD-10-CM

## 2018-08-04 DIAGNOSIS — K621 Rectal polyp: Secondary | ICD-10-CM

## 2018-08-04 DIAGNOSIS — D122 Benign neoplasm of ascending colon: Secondary | ICD-10-CM | POA: Diagnosis not present

## 2018-08-04 DIAGNOSIS — D128 Benign neoplasm of rectum: Secondary | ICD-10-CM | POA: Insufficient documentation

## 2018-08-04 HISTORY — PX: COLONOSCOPY WITH PROPOFOL: SHX5780

## 2018-08-04 SURGERY — COLONOSCOPY WITH PROPOFOL
Anesthesia: General

## 2018-08-04 MED ORDER — PROPOFOL 500 MG/50ML IV EMUL
INTRAVENOUS | Status: AC
  Filled 2018-08-04: qty 200

## 2018-08-04 MED ORDER — PROPOFOL 10 MG/ML IV BOLUS
INTRAVENOUS | Status: DC | PRN
  Administered 2018-08-04: 60 mg via INTRAVENOUS
  Administered 2018-08-04: 40 mg via INTRAVENOUS

## 2018-08-04 MED ORDER — SODIUM CHLORIDE 0.9 % IJ SOLN
INTRAMUSCULAR | Status: DC | PRN
  Administered 2018-08-04: 5 mL via INTRAVENOUS

## 2018-08-04 MED ORDER — PHENYLEPHRINE HCL 10 MG/ML IJ SOLN
INTRAMUSCULAR | Status: DC | PRN
  Administered 2018-08-04 (×3): 50 ug via INTRAVENOUS

## 2018-08-04 MED ORDER — LIDOCAINE HCL (PF) 2 % IJ SOLN
INTRAMUSCULAR | Status: AC
  Filled 2018-08-04: qty 20

## 2018-08-04 MED ORDER — PROPOFOL 500 MG/50ML IV EMUL
INTRAVENOUS | Status: DC | PRN
  Administered 2018-08-04: 160 ug/kg/min via INTRAVENOUS

## 2018-08-04 MED ORDER — SODIUM CHLORIDE 0.9 % IV SOLN
INTRAVENOUS | Status: DC
  Administered 2018-08-04: 1000 mL via INTRAVENOUS

## 2018-08-04 NOTE — Anesthesia Post-op Follow-up Note (Signed)
Anesthesia QCDR form completed.        

## 2018-08-04 NOTE — Anesthesia Preprocedure Evaluation (Signed)
Anesthesia Evaluation  Patient identified by MRN, date of birth, ID band Patient awake    Reviewed: Allergy & Precautions, NPO status , Patient's Chart, lab work & pertinent test results  Airway Mallampati: II  TM Distance: >3 FB     Dental   Pulmonary neg pulmonary ROS,    Pulmonary exam normal        Cardiovascular + Peripheral Vascular Disease  Normal cardiovascular exam     Neuro/Psych  Headaches, PSYCHIATRIC DISORDERS Anxiety Depression    GI/Hepatic negative GI ROS, Neg liver ROS,   Endo/Other    Renal/GU negative Renal ROS  negative genitourinary   Musculoskeletal negative musculoskeletal ROS (+)   Abdominal Normal abdominal exam  (+)   Peds negative pediatric ROS (+)  Hematology negative hematology ROS (+)   Anesthesia Other Findings Past Medical History: No date: Allergy No date: Anxiety No date: Chicken pox No date: Depression No date: Gestational diabetes No date: Hyperlipidemia No date: Migraines No date: Mononucleosis  Reproductive/Obstetrics                             Anesthesia Physical Anesthesia Plan  ASA: II  Anesthesia Plan: General   Post-op Pain Management:    Induction: Intravenous  PONV Risk Score and Plan: TIVA  Airway Management Planned: Nasal Cannula  Additional Equipment:   Intra-op Plan:   Post-operative Plan:   Informed Consent: I have reviewed the patients History and Physical, chart, labs and discussed the procedure including the risks, benefits and alternatives for the proposed anesthesia with the patient or authorized representative who has indicated his/her understanding and acceptance.   Dental advisory given  Plan Discussed with: CRNA and Surgeon  Anesthesia Plan Comments:         Anesthesia Quick Evaluation

## 2018-08-04 NOTE — H&P (Signed)
Jonathon Bellows, MD [address], [place], [place], [place], [address] [address], [place], [place], [place], [address] Phone: [phone]  Fax: [phone]  Primary Care Physician:  Trinna Post, PA-C   Pre-Procedure History & Physical: HPI:  SYNDI PUA is a 52 y.o. female is here for an colonoscopy.   Past Medical History:  Diagnosis Date  . Allergy   . Anxiety   . Chicken pox   . Depression   . Gestational diabetes   . Hyperlipidemia   . Migraines   . Mononucleosis     Past Surgical History:  Procedure Laterality Date  . CARPAL TUNNEL RELEASE    . CESAREAN SECTION  1997  . TONSILLECTOMY AND ADENOIDECTOMY  1975    Prior to Admission medications   Medication Sig Start Date End Date Taking? Authorizing Provider  citalopram (CELEXA) 20 MG tablet Take 1 tablet (20 mg total) by mouth daily. 04/13/18   Trinna Post, PA-C  clonazePAM (KLONOPIN) 0.5 MG tablet Take 1 tablet (0.5 mg total) by mouth 2 (two) times daily as needed for anxiety. 06/06/17   Coral Spikes, DO  Misc Natural Products (OSTEO BI-FLEX ADV TRIPLE ST PO) Take by mouth.    [provider]  Multiple Vitamin (MULTIVITAMIN) tablet Take 1 tablet by mouth daily.    [provider]  olopatadine (PATANOL) 0.1 % ophthalmic solution Apply to eye. 07/31/17   [provider]    Allergies as of 06/08/2018 - Review Complete 06/07/2018  Allergen Reaction Noted  . Penicillins Other (See Comments) 12/29/2015    Family History  Problem Relation Age of Onset  . Arthritis Mother   . Hypertension Mother   . Depression Mother   . Arthritis Maternal Grandmother   . Hyperlipidemia Maternal Grandmother   . Diabetes Maternal Grandmother   . Hypertension Maternal Grandmother   . Stroke Maternal Grandfather   . Hyperlipidemia Maternal Grandfather   . Alzheimer's disease Maternal Grandfather   . Hyperlipidemia Paternal Grandmother   . Diabetes Paternal Grandmother   .  Hypertension Paternal Grandmother   . Hyperlipidemia Paternal Grandfather   . Diabetes Father 14  . Hyperlipidemia Father   . Diabetes Maternal Aunt   . Heart disease Maternal Aunt   . Hypertension Maternal Aunt     Social History   Socioeconomic History  . Marital status: Married    Spouse name: Not on file  . Number of children: 1  . Years of education: 1  . Highest education level: Not on file  Occupational History  . Occupation: [employer]  Social Needs  . Financial resource strain: Not on file  . Food insecurity:    Worry: Not on file    Inability: Not on file  . Transportation needs:    Medical: Not on file    Non-medical: Not on file  Tobacco Use  . Smoking status: Never Smoker  . Smokeless tobacco: Never Used  Substance and Sexual Activity  . Alcohol use: Yes    Alcohol/week: 2.0 standard drinks    Types: 2 Cans of beer per week  . Drug use: No  . Sexual activity: Not Currently    Partners: Male  Lifestyle  . Physical activity:    Days per week: Not on file    Minutes per session: Not on file  . Stress: Not on file  Relationships  . Social connections:    Talks on phone: Not on file    Gets together:  Not on file    Attends religious service: Not on file    Active member of club or organization: Not on file    Attends meetings of clubs or organizations: Not on file    Relationship status: Not on file  . Intimate partner violence:    Fear of current or ex partner: Not on file    Emotionally abused: Not on file    Physically abused: Not on file    Forced sexual activity: Not on file  Other Topics Concern  . 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 Neck:  Supple; no masses or thyromegaly. Lungs:  Clear throughout to auscultation, normal respiratory effort.    Heart:  +S1, +S2, Regular rate and rhythm, No edema. Abdomen:  Soft, nontender and nondistended. Normal bowel sounds, without guarding, and without rebound.   Neurologic:  Alert and  oriented x4;  grossly normal neurologically.  Impression/Plan: Lynwood Dawley Gravley is here for an colonoscopy to be performed for Screening colonoscopy average risk   Risks, benefits, limitations, and alternatives regarding  colonoscopy have been reviewed with the patient.  Questions have been answered.  All parties agreeable.   Jonathon Bellows, MD  08/04/2018, 8:03 AM

## 2018-08-04 NOTE — Anesthesia Procedure Notes (Signed)
Performed by: Lynae Pederson, CRNA Pre-anesthesia Checklist: Patient identified, Emergency Drugs available, Suction available, Patient being monitored and Timeout performed Patient Re-evaluated:Patient Re-evaluated prior to induction Oxygen Delivery Method: Nasal cannula Induction Type: IV induction       

## 2018-08-04 NOTE — Op Note (Signed)
[facility] Gastroenterology Patient Name: Tamara Weeks Procedure Date: 08/04/2018 8:06 AM MRN: [id number] Account #: [id number] Date of Birth: December 08, 1966 Admit Type: Outpatient Age: 52 Room: [facility] ENDO ROOM 4 Gender: Female Note Status: Finalized Procedure:            Colonoscopy Indications:          Screening for colorectal malignant neoplasm Providers:            Jonathon Bellows MD, MD Referring MD:         No Local Md, MD (Referring MD) Medicines:            Monitored Anesthesia Care Complications:        No immediate complications. Procedure:            Pre-Anesthesia Assessment:                       - Prior to the procedure, a History and Physical was                        performed, and patient medications, allergies and                        sensitivities were reviewed. The patient's tolerance of                        previous anesthesia was reviewed.                       - The risks and benefits of the procedure and the                        sedation options and risks were discussed with the                        patient. All questions were answered and informed                        consent was obtained.                       - ASA Grade Assessment: II - A patient with mild                        systemic disease.                       After obtaining informed consent, the colonoscope was                        passed under direct vision. Throughout the procedure,                        the patient's blood pressure, pulse, and oxygen                        saturations were monitored continuously. The                        Colonoscope was introduced through the anus and  advanced to the the cecum, identified by the                        appendiceal orifice, IC valve and transillumination.                        The colonoscopy was performed with ease. The patient                        tolerated the procedure well. The  quality of the bowel                        preparation was good. Findings:      The perianal and digital rectal examinations were normal.      Four sessile polyps were found in the rectum, transverse colon,       ascending colon and cecum. The polyps were 4 to 6 mm in size. These       polyps were removed with a cold snare. Resection and retrieval were       complete.      A 12 mm polyp was found in the transverse colon. The polyp was sessile.       Preparations were made for mucosal resection. 5 mL of saline was       injected with adequate lift of the lesion from the muscularis propria.       Snare mucosal resection with suction (via the working channel) retrieval       was performed. A 14 mm area was resected. Resection and retrieval were       complete. There was no bleeding at the end of the maneuver. To prevent       bleeding after mucosal resection, one hemostatic clip was successfully       placed. There was no bleeding during, or at the end, of the procedure.      The exam was otherwise without abnormality on direct and retroflexion       views. Impression:           - Four 4 to 6 mm polyps in the rectum, in the                        transverse colon, in the ascending colon and in the                        cecum, removed with a cold snare. Resected and                        retrieved.                       - One 12 mm polyp in the transverse colon, removed with                        mucosal resection. Resected and retrieved. Clip was                        placed.                       - The examination was otherwise normal on direct and  retroflexion views.                       - Mucosal resection was performed. Resection and                        retrieval were complete. Recommendation:       - Discharge patient to home (with escort).                       - Resume previous diet.                       - Continue present medications.                        - Await pathology results.                       - Repeat colonoscopy in 3 years for surveillance. Procedure Code(s):    --- Professional ---                       [phone], 59, Colonoscopy, flexible; with endoscopic                        mucosal resection                       [id number], Colonoscopy, flexible; with removal of tumor(s),                        polyp(s), or other lesion(s) by snare technique Diagnosis Code(s):    --- Professional ---                       Z12.11, Encounter for screening for malignant neoplasm                        of colon                       K62.1, Rectal polyp                       D12.3, Benign neoplasm of transverse colon (hepatic                        flexure or splenic flexure)                       D12.2, Benign neoplasm of ascending colon                       D12.0, Benign neoplasm of cecum CPT copyright 2017 American Medical Association. All rights reserved. The codes documented in this report are preliminary and upon coder review may  be revised to meet current compliance requirements. Jonathon Bellows, MD Jonathon Bellows MD, MD 08/04/2018 8:45:37 AM This report has been signed electronically. Number of Addenda: 0 Note Initiated On: 08/04/2018 8:06 AM Scope Withdrawal Time: 0 hours 27 minutes 34 seconds  Total Procedure Duration: 0 hours 30 minutes 50 seconds       [facility]

## 2018-08-04 NOTE — Transfer of Care (Signed)
Immediate Anesthesia Transfer of Care Note  Patient: Tamara Weeks  Procedure(s) Performed: COLONOSCOPY WITH PROPOFOL (N/A )  Patient Location: PACU  Anesthesia Type:General  Level of Consciousness: awake, alert  and oriented  Airway & Oxygen Therapy: Patient Spontanous Breathing and Patient connected to nasal cannula oxygen  Post-op Assessment: Report given to RN and Post -op Vital signs reviewed and stable  Post vital signs: Reviewed and stable  Last Vitals:  Vitals Value Taken Time  BP 89/43 08/04/2018  8:47 AM  Temp 36.3 C 08/04/2018  8:47 AM  Pulse 71 08/04/2018  8:47 AM  Resp 19 08/04/2018  8:47 AM  SpO2 100 % 08/04/2018  8:47 AM    Last Pain:  Vitals:   08/04/18 0847  TempSrc: Tympanic  PainSc: 0-No pain         Complications: No apparent anesthesia complications

## 2018-08-05 NOTE — Anesthesia Postprocedure Evaluation (Signed)
Anesthesia Post Note  Patient: Tamara Weeks  Procedure(s) Performed: COLONOSCOPY WITH PROPOFOL (N/A )  Patient location during evaluation: PACU Anesthesia Type: General Level of consciousness: awake and alert and oriented Pain management: pain level controlled Vital Signs Assessment: post-procedure vital signs reviewed and stable Respiratory status: spontaneous breathing Cardiovascular status: blood pressure returned to baseline Anesthetic complications: no     Last Vitals:  Vitals:   08/04/18 0907 08/04/18 0917  BP: 97/68 97/68  Pulse: 69 66  Resp: (!) 22 19  Temp:    SpO2: 100% 100%    Last Pain:  Vitals:   08/04/18 0907  TempSrc:   PainSc: 0-No pain                 Claudie Rathbone

## 2018-08-07 ENCOUNTER — Encounter: Payer: Self-pay | Admitting: Gastroenterology

## 2018-08-07 LAB — SURGICAL PATHOLOGY

## 2018-08-09 ENCOUNTER — Other Ambulatory Visit: Payer: Self-pay | Admitting: Physician Assistant

## 2018-08-09 DIAGNOSIS — F419 Anxiety disorder, unspecified: Secondary | ICD-10-CM

## 2018-08-15 ENCOUNTER — Encounter: Payer: Self-pay | Admitting: Gastroenterology

## 2018-12-14 ENCOUNTER — Encounter: Payer: Self-pay | Admitting: Certified Nurse Midwife

## 2019-01-02 ENCOUNTER — Ambulatory Visit (INDEPENDENT_AMBULATORY_CARE_PROVIDER_SITE_OTHER): Payer: Managed Care, Other (non HMO) | Admitting: Certified Nurse Midwife

## 2019-01-02 ENCOUNTER — Encounter: Payer: Self-pay | Admitting: Certified Nurse Midwife

## 2019-01-02 VITALS — BP 120/70 | Ht 65.0 in | Wt 165.0 lb

## 2019-01-02 DIAGNOSIS — D369 Benign neoplasm, unspecified site: Secondary | ICD-10-CM | POA: Insufficient documentation

## 2019-01-02 DIAGNOSIS — Z23 Encounter for immunization: Secondary | ICD-10-CM | POA: Diagnosis not present

## 2019-01-02 DIAGNOSIS — Z01419 Encounter for gynecological examination (general) (routine) without abnormal findings: Secondary | ICD-10-CM | POA: Diagnosis not present

## 2019-01-02 NOTE — Progress Notes (Signed)
Gynecology Annual Exam  PCP: Trinna Post, PA-C  Chief Complaint:  Chief Complaint  Patient presents with  . Gynecologic Exam    History of Present Illness:Tamara Weeks is a 53 year old Caucasian/White female, G1 P1001, who presents for her annual exam. Would also like a flu vaccine Her menses are absent since discontinuing her BCP in March 2017. She still has hot flashes. Is not on any hormone replacement.  She has had no spotting.   The patient's past medical history is notable for a history of hyperlipidemia, anxiety/depression, and GDM.Marland Kitchen  Since her last annual GYN exam dated 09/09/2017, she has lost 14# with eating well and exercising. Son graduated and is in [place].   She is not sexually active. She has been sexually active in the past but not currently.  Her most recent pap smear was obtained 09/09/2017  and was NIL Her most recent mammogram obtained on 11/14/2018 was normal.  There is no family history of breast cancer.  There is no family history of ovarian cancer.  The patient does not do monthly self breast exams. She has had a recent colonoscopy 08/04/2018. Results: adenomatous polyps. Next due in 3 years The patient does not smoke.  The patient does drink a glass of wine or a beer 3-4/week The patient does not use illegal drugs.  The patient does exercise at gym 3-5x/week. The patient may not get adequate calcium in her diet, and  she stopped taking a calcium supplement.  She has  had a recent cholesterol screen 2019  and it was borderline as was her Hemoglobin A1C (5.8%).     Review of Systems: Review of Systems  Constitutional: Negative for chills, fever and weight loss.  HENT: Negative for congestion, sinus pain and sore throat.   Eyes: Negative for blurred vision, pain and redness.  Respiratory: Negative for hemoptysis, shortness of breath and wheezing.   Cardiovascular: Negative for chest pain, palpitations and leg swelling.  Gastrointestinal:  Negative for abdominal pain, blood in stool, diarrhea, heartburn, nausea and vomiting.  Genitourinary: Negative for dysuria, frequency, hematuria and urgency.  Musculoskeletal: Negative for back pain, joint pain and myalgias.  Skin: Negative for itching and rash.  Neurological: Negative for dizziness, tingling and headaches.  Endo/Heme/Allergies: Positive for environmental allergies. Negative for polydipsia. Does not bruise/bleed easily.       Negative for hirsutism Positive for hot flashes  Psychiatric/Behavioral: Positive for depression. The patient is nervous/anxious. The patient does not have insomnia.     Past Medical History:  Past Medical History:  Diagnosis Date  . Allergy   . Anxiety   . Chicken pox   . Depression   . Gestational diabetes   . Hyperlipidemia   . Migraines   . Mononucleosis     Past Surgical History:  Past Surgical History:  Procedure Laterality Date  . CARPAL TUNNEL RELEASE    . CESAREAN SECTION  1997  . COLONOSCOPY WITH PROPOFOL N/A 08/04/2018   Procedure: COLONOSCOPY WITH PROPOFOL;  Surgeon: Jonathon Bellows, MD;  Location: [facility] ENDOSCOPY;  Service: Gastroenterology;  Laterality: N/A;  . TONSILLECTOMY AND ADENOIDECTOMY  1975    Family History:  Family History  Problem Relation Age of Onset  . Arthritis Mother   . Hypertension Mother   . Depression Mother   . Arthritis Maternal Grandmother   . Hyperlipidemia Maternal Grandmother   . Diabetes Maternal Grandmother   . Hypertension Maternal Grandmother   . Stroke Maternal Grandfather   .  Hyperlipidemia Maternal Grandfather   . Alzheimer's disease Maternal Grandfather   . Hyperlipidemia Paternal Grandmother   . Diabetes Paternal Grandmother   . Hypertension Paternal Grandmother   . Hyperlipidemia Paternal Grandfather   . Diabetes Father 75  . Hyperlipidemia Father   . Diabetes Maternal Aunt   . Heart disease Maternal Aunt   . Hypertension Maternal Aunt     Social History:  Social History    Socioeconomic History  . Marital status: Married    Spouse name: Not on file  . Number of children: 1  . Years of education: 41  . Highest education level: Not on file  Occupational History  . Occupation: [employer]  Social Needs  . Financial resource strain: Not on file  . Food insecurity:    Worry: Not on file    Inability: Not on file  . Transportation needs:    Medical: Not on file    Non-medical: Not on file  Tobacco Use  . Smoking status: Never Smoker  . Smokeless tobacco: Never Used  Substance and Sexual Activity  . Alcohol use: Yes    Alcohol/week: 2.0 standard drinks    Types: 2 Cans of beer per week  . Drug use: No  . Sexual activity: Not Currently    Partners: Male  Lifestyle  . Physical activity:    Days per week: Not on file    Minutes per session: Not on file  . Stress: Not on file  Relationships  . Social connections:    Talks on phone: Not on file    Gets together: Not on file    Attends religious service: Not on file    Active member of club or organization: Not on file    Attends meetings of clubs or organizations: Not on file    Relationship status: Not on file  . Intimate partner violence:    Fear of current or ex partner: Not on file    Emotionally abused: Not on file    Physically abused: Not on file    Forced sexual activity: Not on file  Other Topics Concern  . 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 Penicillins Other (See Comments)    Medications: Current Outpatient Medications on File Prior to Visit  Medication Sig Dispense Refill  . citalopram (CELEXA) 20 MG tablet TAKE 1 TABLET(20 MG) BY MOUTH DAILY 90 tablet 3  . clonazePAM (KLONOPIN) 0.5 MG tablet Take 1 tablet (0.5 mg total) by mouth 2 (two) times daily as needed for anxiety. 20 tablet 0  . Misc Natural Products (OSTEO BI-FLEX ADV TRIPLE ST PO) Take by  mouth.    . Multiple Vitamin (MULTIVITAMIN) tablet Take 1 tablet by mouth daily.    Marland Kitchen olopatadine (PATANOL) 0.1 % ophthalmic solution Apply to eye.     No current facility-administered medications on file prior to visit.    Calcium with vitaminD3   Physical Exam Vitals: BP 120/70   Ht 5\' 5"  (1.651 m)   Wt 165 lb (74.8 kg)   LMP 03/02/2016   BMI 27.46 kg/m   General: WF in  NAD HEENT: normocephalic, anicteric Neck: no thyroid enlargement, no palpable nodules, no cervical lymphadenopathy  Pulmonary: No increased work of breathing, CTAB Cardiovascular: RRR, without murmur  Breast: Breast symmetrical, no tenderness, no palpable nodules or masses, no skin or nipple retraction  present, no nipple discharge.  No axillary, infraclavicular or supraclavicular lymphadenopathy. Abdomen: Soft, non-tender, non-distended.  Umbilicus without lesions.  No hepatomegaly or masses palpable. No evidence of hernia. Genitourinary:  External: Normal external female genitalia.  Normal urethral meatus, normal Bartholin's and Skene's glands.    Vagina: Normal vaginal mucosa, some atrophic changes requiring one digit vaginal exam   Cervix:  no bleeding, non-tender  Uterus: Anteverted, normal size, shape, and consistency, mobile, and non-tender  Adnexa: No adnexal masses, non-tender  Rectal: deferred  Lymphatic: no evidence of inguinal lymphadenopathy Extremities: no edema, erythema, or tenderness Neurologic: Grossly intact Psychiatric: mood appropriate, affect full     Assessment: 53 y.o. G1P1001 normal menopausal gyn exam  Plan:   1) Breast cancer screening - recommend monthly self breast exam monthly and annual mammograms. Mammogram is UTD  2) Colon cancer screening: Colonoscopy UTD. Next due in 2022  3) Cervical cancer screening - Pap was not done. ASCCP guidelines and rational discussed.  Patient opts for every other year screening interval  4) Contraception - menopausal >1 year. No longer  needs contraception.  5) Routine healthcare maintenance including cholesterol and diabetes screening UTD. Received flu vaccine today  6)Discussed calcium and vitamin D3 requirements and encouraged to continue calcium and vitamin D3 supplement.  7) RTO 1 year and prn.  Dalia Heading, CNM

## 2019-02-23 ENCOUNTER — Ambulatory Visit: Payer: Managed Care, Other (non HMO) | Admitting: Physician Assistant

## 2019-02-23 ENCOUNTER — Encounter: Payer: Self-pay | Admitting: Physician Assistant

## 2019-02-23 VITALS — BP 111/72 | HR 67 | Temp 97.7°F | Wt 164.7 lb

## 2019-02-23 DIAGNOSIS — R238 Other skin changes: Secondary | ICD-10-CM | POA: Diagnosis not present

## 2019-02-23 DIAGNOSIS — F418 Other specified anxiety disorders: Secondary | ICD-10-CM

## 2019-02-23 DIAGNOSIS — M545 Low back pain, unspecified: Secondary | ICD-10-CM

## 2019-02-23 MED ORDER — CLOBETASOL PROPIONATE 0.05 % EX FOAM: Freq: Two times a day (BID) | CUTANEOUS | 0 refills | Status: DC

## 2019-02-23 MED ORDER — CITALOPRAM HYDROBROMIDE 40 MG PO TABS: 40.0000 mg | ORAL_TABLET | Freq: Every day | ORAL | 1 refills | Status: DC

## 2019-02-23 MED ORDER — HYDROXYZINE HCL 10 MG PO TABS: 10.0000 mg | ORAL_TABLET | Freq: Every day | ORAL | 0 refills | Status: DC | PRN

## 2019-02-23 NOTE — Patient Instructions (Signed)
Depression Screening Depression screening is a tool that your health care provider can use to learn if you have symptoms of depression. Depression is a common condition with many symptoms that are also often found in other conditions. Depression is treatable, but it must first be diagnosed. You may not know that certain feelings, thoughts, and behaviors that you are having can be symptoms of depression. Taking a depression screening test can help you and your health care provider decide if you need more assessment, or if you should be referred to a mental health care provider. What are the screening tests?  You may have a physical exam to see if another condition is affecting your mental health. You may have a blood or urine sample taken during the physical exam.  You may be interviewed using a screening tool that was developed from research, such as one of these: ? Patient Health Questionnaire (PHQ). This is a set of either 2 or 9 questions. A health care provider who has been trained to score this screening test uses a guide to assess if your symptoms suggest that you may have depression. ? Hamilton Depression Rating Scale (HAM-D). This is a set of either 17 or 24 questions. You may be asked to take it again during or after your treatment, to see if your depression has gotten better. ? Beck Depression Inventory (BDI). This is a set of 21 multiple choice questions. Your health care provider scores your answers to assess:  Your level of depression, ranging from mild to severe.  Your response to treatment.  Your health care provider may talk with you about your daily activities, such as eating, sleeping, work, and recreation, and ask if you have had any changes in activity.  Your health care provider may ask you to see a mental health specialist, such as a psychiatrist or psychologist, for more evaluation. Who should be screened for depression?   All adults, including adults with a family history  of a mental health disorder.  Adolescents who are 12-18 years old.  People who are recovering from a myocardial infarction (MI).  Pregnant women, or women who have given birth.  People who have a long-term (chronic) illness.  Anyone who has been diagnosed with another type of a mental health disorder.  Anyone who has symptoms that could show depression. What do my results mean? Your health care provider will review the results of your depression screening, physical exam, and lab tests. Positive screens suggest that you may have depression. Screening is the first step in getting the care that you may need. It is up to you to get your screening results. Ask your health care provider, or the department that is doing your screening tests, when your results will be ready. Talk with your health care provider about your results and diagnosis. A diagnosis of depression is made using the Diagnostic and Statistical Manual of Mental Disorders (DSM-V). This is a book that lists the number and type of symptoms that must be present for a health care provider to give a specific diagnosis.  Your health care provider may work with you to treat your symptoms of depression, or your health care provider may help you find a mental health provider who can assess, diagnose, and treat your depression. Get help right away if:  You have thoughts about hurting yourself or others. If you ever feel like you may hurt yourself or others, or have thoughts about taking your own life, get help right away. You   can go to your nearest emergency department or call:  Your local emergency services (911 in the [place]  A suicide crisis helpline, such as the National Suicide Prevention Lifeline at 1-800-273-8255. This is open 24 hours a day. Summary  Depression screening is the first step in getting the help that you may need.  If your screening test shows symptoms of depression (is positive), your health care provider may ask  you to see a mental health provider.  Anyone who is age 12 or older should be screened for depression. This information is not intended to replace advice given to you by your health care provider. Make sure you discuss any questions you have with your health care provider. Document Released: 04/22/2017 Document Revised: 04/22/2017 Document Reviewed: 04/22/2017 Elsevier Interactive Patient Education  2019 Elsevier Inc.  

## 2019-02-23 NOTE — Progress Notes (Signed)
Patient: Tamara Weeks Female    DOB: 21-Jan-1966   53 y.o.   MRN: [id number] Visit Date: 02/23/2019  Today's Provider: Trinna Post, PA-C   Chief Complaint  Patient presents with  . Back Pain  . Anxiety   Subjective:    Back Pain  The current episode started more than 1 year ago. The problem occurs intermittently. The problem is unchanged. Pain location: Lower back, mainly left side. Quality: pulling and sharp. The pain does not radiate. The pain is at a severity of 3/10. The pain is mild. The pain is worse during the day. The symptoms are aggravated by bending, position and twisting. Stiffness is present in the morning. She has tried heat for the symptoms. The treatment provided mild relief.    Anxiety Patient presents today for anxiety follow-up. Patient was last seen for anxiety about 1 year ago. Patient is currently taking Celexa 20 mg daily for anxiety and would like to possibly going up on her dose. Working 60 hours a week which is not modifiable at this point. Would like to increase celexa to see if this is helpful.   Also reports itchy scalp lesions that have worsened. She reports she got a topical steroid from her dermatologist but she has run out since bottle was very small.   Patient reports low back pain that does not radiated into legs.   Allergies  Allergen Reactions  . Penicillins Other (See Comments)     Current Outpatient Medications:  .  citalopram (CELEXA) 20 MG tablet, TAKE 1 TABLET(20 MG) BY MOUTH DAILY, Disp: 90 tablet, Rfl: 3 .  clonazePAM (KLONOPIN) 0.5 MG tablet, Take 1 tablet (0.5 mg total) by mouth 2 (two) times daily as needed for anxiety., Disp: 20 tablet, Rfl: 0 .  Misc Natural Products (OSTEO BI-FLEX ADV TRIPLE ST PO), Take by mouth., Disp: , Rfl:  .  Multiple Vitamin (MULTIVITAMIN) tablet, Take 1 tablet by mouth daily., Disp: , Rfl:  .  olopatadine (PATANOL) 0.1 % ophthalmic solution, Apply to eye., Disp: , Rfl:   Review of Systems   HENT: Negative.   Respiratory: Negative.   Genitourinary: Negative.   Musculoskeletal: Positive for back pain.  Psychiatric/Behavioral: Negative.     Social History   Tobacco Use  . Smoking status: Never Smoker  . Smokeless tobacco: Never Used  Substance Use Topics  . Alcohol use: Yes    Alcohol/week: 2.0 standard drinks    Types: 2 Cans of beer per week      Objective:   BP 111/72 (BP Location: Left Arm, Patient Position: Sitting, Cuff Size: Normal)   Pulse 67   Temp 97.7 F (36.5 C) (Oral)   Wt 164 lb 11.2 oz (74.7 kg)   LMP 03/02/2016   SpO2 99%   BMI 27.41 kg/m  Vitals:   02/23/19 1205  BP: 111/72  Pulse: 67  Temp: 97.7 F (36.5 C)  TempSrc: Oral  SpO2: 99%  Weight: 164 lb 11.2 oz (74.7 kg)     Physical Exam Constitutional:      Appearance: Normal appearance.  Cardiovascular:     Rate and Rhythm: Normal rate and regular rhythm.     Heart sounds: Normal heart sounds.  Pulmonary:     Effort: Pulmonary effort is normal.     Breath sounds: Normal breath sounds.  Musculoskeletal:     Comments: SLR negative bilaterally.   Skin:    General: Skin is warm and dry.  Neurological:     Mental Status: She is alert and oriented to person, place, and time. Mental status is at baseline.  Psychiatric:        Mood and Affect: Mood normal.        Behavior: Behavior normal.         Assessment & Plan    1. Depression with anxiety  Increase celexa 40 mg as below. Will give hydroxyzine for PRN anxiety.   - citalopram (CELEXA) 40 MG tablet; Take 1 tablet (40 mg total) by mouth daily.  Dispense: 90 tablet; Refill: 1 - hydrOXYzine (ATARAX/VISTARIL) 10 MG tablet; Take 1 tablet (10 mg total) by mouth daily as needed.  Dispense: 30 tablet; Refill: 0  2. Dry scalp  Looks like it might be psoriasis, will give clobetasol foam as below. Suggest she follow up with dermatology.   - clobetasol (OLUX) 0.05 % topical foam; Apply topically 2 (two) times daily.   Dispense: 50 g; Refill: 0 - TSH  3. Acute bilateral low back pain without sciatica  The entirety of the information documented in the History of Present Illness, Review of Systems and Physical Exam were personally obtained by me. Portions of this information were initially documented by [facility], CMA and reviewed by me for thoroughness and accuracy.   F/U 3 months for anxiety.         Trinna Post, PA-C  [facility]

## 2019-04-10 ENCOUNTER — Other Ambulatory Visit: Payer: Self-pay | Admitting: Physician Assistant

## 2019-04-10 DIAGNOSIS — R238 Other skin changes: Secondary | ICD-10-CM

## 2019-04-10 NOTE — Telephone Encounter (Signed)
L.O.V. 02/23/2019, please advise.

## 2019-06-01 ENCOUNTER — Ambulatory Visit: Payer: Managed Care, Other (non HMO) | Admitting: Physician Assistant

## 2019-06-01 ENCOUNTER — Encounter: Payer: Self-pay | Admitting: Physician Assistant

## 2019-06-01 ENCOUNTER — Other Ambulatory Visit: Payer: Self-pay

## 2019-06-01 VITALS — BP 109/74 | HR 67 | Temp 98.1°F | Wt 165.8 lb

## 2019-06-01 DIAGNOSIS — H6992 Unspecified Eustachian tube disorder, left ear: Secondary | ICD-10-CM

## 2019-06-01 DIAGNOSIS — H6982 Other specified disorders of Eustachian tube, left ear: Secondary | ICD-10-CM | POA: Diagnosis not present

## 2019-06-01 DIAGNOSIS — F418 Other specified anxiety disorders: Secondary | ICD-10-CM | POA: Diagnosis not present

## 2019-06-01 NOTE — Progress Notes (Signed)
Patient: Tamara Weeks Female    DOB: September 20, 1966   53 y.o.   MRN: [id number] Visit Date: 06/01/2019  Today's Provider: Trinna Post, PA-C   Chief Complaint  Patient presents with  . Ear Fullness   Subjective:    Ear Fullness  There is pain in the left ear. This is a new problem. The current episode started 1 to 4 weeks ago. The problem has been unchanged. There has been no fever. The pain is at a severity of 0/10. The patient is experiencing no pain. Pertinent negatives include no ear discharge, headaches or hearing loss. She has tried nothing for the symptoms.   Last visit, her celexa was increased from 20 mg to 40 mg QD for worsening depression and anxiety. She reports improvement with this and feels her mood is well controlled.  She is also doing well with the clobetasol foam for her scalp rash.    Allergies  Allergen Reactions  . Penicillins Other (See Comments)     Current Outpatient Medications:  .  clobetasol (OLUX) 0.05 % topical foam, APPLY EXTERNALLY TO THE AFFECTED AREA TWICE DAILY, Disp: 50 g, Rfl: 0 .  clonazePAM (KLONOPIN) 0.5 MG tablet, Take 1 tablet (0.5 mg total) by mouth 2 (two) times daily as needed for anxiety., Disp: 20 tablet, Rfl: 0 .  hydrOXYzine (ATARAX/VISTARIL) 10 MG tablet, Take 1 tablet (10 mg total) by mouth daily as needed., Disp: 30 tablet, Rfl: 0 .  Misc Natural Products (OSTEO BI-FLEX ADV TRIPLE ST PO), Take by mouth., Disp: , Rfl:  .  Multiple Vitamin (MULTIVITAMIN) tablet, Take 1 tablet by mouth daily., Disp: , Rfl:  .  olopatadine (PATANOL) 0.1 % ophthalmic solution, Apply to eye., Disp: , Rfl:  .  citalopram (CELEXA) 40 MG tablet, Take 1 tablet (40 mg total) by mouth daily., Disp: 90 tablet, Rfl: 1  Review of Systems  Constitutional: Negative.   HENT: Negative for ear discharge and hearing loss.   Respiratory: Negative.   Genitourinary: Negative.   Neurological: Negative for headaches.  Hematological: Negative.      Social History   Tobacco Use  . Smoking status: Never Smoker  . Smokeless tobacco: Never Used  Substance Use Topics  . Alcohol use: Yes    Alcohol/week: 2.0 standard drinks    Types: 2 Cans of beer per week      Objective:   BP 109/74 (BP Location: Left Arm, Patient Position: Sitting, Cuff Size: Normal)   Pulse 67   Temp 98.1 F (36.7 C) (Oral)   Wt 165 lb 12.8 oz (75.2 kg)   LMP 03/02/2016   BMI 27.59 kg/m  Vitals:   06/01/19 1102  BP: 109/74  Pulse: 67  Temp: 98.1 F (36.7 C)  TempSrc: Oral  Weight: 165 lb 12.8 oz (75.2 kg)     Physical Exam Constitutional:      Appearance: Normal appearance.  HENT:     Right Ear: Tympanic membrane and ear canal normal.     Left Ear: Ear canal normal.     Ears:     Comments: Left TM opaque.  Skin:    General: Skin is warm and dry.  Neurological:     Mental Status: She is alert.  Psychiatric:        Mood and Affect: Mood normal.        Behavior: Behavior normal.         Assessment & Plan    1. Dysfunction  of left eustachian tube  Recommend daily 2nd gen antihistamine with flonase. May take several weeks to resolve.  2. Depression with anxiety  Continue celexa 40 mg. Follow up in one year. We will remove her 06/15/2019 appointment as we covered the issue today.  The entirety of the information documented in the History of Present Illness, Review of Systems and Physical Exam were personally obtained by me. Portions of this information were initially documented by [facility], CMA and reviewed by me for thoroughness and accuracy.   F/u 1 year      Trinna Post, PA-C  [facility]

## 2019-06-01 NOTE — Patient Instructions (Signed)

## 2019-06-15 ENCOUNTER — Encounter: Payer: Managed Care, Other (non HMO) | Admitting: Physician Assistant

## 2019-11-02 ENCOUNTER — Other Ambulatory Visit: Payer: Self-pay | Admitting: Physician Assistant

## 2019-11-02 DIAGNOSIS — F418 Other specified anxiety disorders: Secondary | ICD-10-CM

## 2019-11-02 NOTE — Telephone Encounter (Signed)
L.O.V. was on 06/01/2019 and no upcoming appointments.

## 2019-11-26 ENCOUNTER — Encounter: Payer: Self-pay | Admitting: Certified Nurse Midwife

## 2020-02-04 ENCOUNTER — Other Ambulatory Visit: Payer: Self-pay | Admitting: Physician Assistant

## 2020-02-04 DIAGNOSIS — R238 Other skin changes: Secondary | ICD-10-CM

## 2020-02-14 ENCOUNTER — Telehealth (INDEPENDENT_AMBULATORY_CARE_PROVIDER_SITE_OTHER): Payer: Managed Care, Other (non HMO) | Admitting: Physician Assistant

## 2020-02-14 DIAGNOSIS — J040 Acute laryngitis: Secondary | ICD-10-CM

## 2020-02-14 DIAGNOSIS — R0981 Nasal congestion: Secondary | ICD-10-CM | POA: Diagnosis not present

## 2020-02-14 MED ORDER — PREDNISONE 10 MG PO TABS: ORAL_TABLET | ORAL | 0 refills | Status: DC

## 2020-02-14 NOTE — Progress Notes (Signed)
Patient: Tamara Weeks Female    DOB: March 21, 1966   54 y.o.   MRN: [id number] Visit Date: 02/14/2020  Today's Provider: Trinna Post, PA-C   Chief Complaint  Patient presents with  . URI   Subjective:    I, Porsha McClurkin,CMA am acting as a Education administrator for [employer].  Virtual Visit via Video Note  I connected with Tamara Weeks on 02/14/20 at  8:00 AM EST by a video enabled telemedicine application and verified that I am speaking with the correct person using two identifiers.  Location: Patient: Home Provider: Office    I discussed the limitations of evaluation and management by telemedicine and the availability of in person appointments. The patient expressed understanding and agreed to proceed.  URI  This is a new problem. The current episode started in the past 7 days. The problem has been unchanged. There has been no fever. Associated symptoms include congestion, coughing, rhinorrhea and sinus pain. Pertinent negatives include no diarrhea, nausea, sore throat, vomiting or wheezing. She has tried nothing for the symptoms. The treatment provided no relief.   Reports sinus congestion x 4-5 days and reports her voice is now hoarse. She is continuing to take flonase. Denies fevers, has had a temperature of 66F. Can't breathe through her nose well.   Allergies  Allergen Reactions  . Penicillins Other (See Comments)     Current Outpatient Medications:  .  citalopram (CELEXA) 40 MG tablet, TAKE 1 TABLET(40 MG) BY MOUTH DAILY, Disp: 90 tablet, Rfl: 1 .  clobetasol (OLUX) 0.05 % topical foam, APPLY EXTERNALLY TO THE AFFECTED AREA TWICE DAILY, Disp: 50 g, Rfl: 0 .  clonazePAM (KLONOPIN) 0.5 MG tablet, Take 1 tablet (0.5 mg total) by mouth 2 (two) times daily as needed for anxiety., Disp: 20 tablet, Rfl: 0 .  hydrOXYzine (ATARAX/VISTARIL) 10 MG tablet, Take 1 tablet (10 mg total) by mouth daily as needed., Disp: 30 tablet, Rfl: 0 .  Misc Natural Products  (OSTEO BI-FLEX ADV TRIPLE ST PO), Take by mouth., Disp: , Rfl:  .  Multiple Vitamin (MULTIVITAMIN) tablet, Take 1 tablet by mouth daily., Disp: , Rfl:  .  olopatadine (PATANOL) 0.1 % ophthalmic solution, Apply to eye., Disp: , Rfl:   Review of Systems  Constitutional: Positive for fatigue. Negative for chills and fever.  HENT: Positive for congestion, postnasal drip, rhinorrhea, sinus pressure, sinus pain and voice change. Negative for sore throat.   Respiratory: Positive for cough. Negative for chest tightness, shortness of breath and wheezing.   Gastrointestinal: Negative for diarrhea, nausea and vomiting.    Social History   Tobacco Use  . Smoking status: Never Smoker  . Smokeless tobacco: Never Used  Substance Use Topics  . Alcohol use: Yes    Alcohol/week: 2.0 standard drinks    Types: 2 Cans of beer per week      Objective:   LMP 03/02/2016  There were no vitals filed for this visit.There is no height or weight on file to calculate BMI.   Physical Exam Constitutional:      General: She is not in acute distress.    Appearance: Normal appearance. She is not ill-appearing.  Pulmonary:     Effort: Pulmonary effort is normal. No respiratory distress.  Neurological:     Mental Status: She is alert.  Psychiatric:        Mood and Affect: Mood normal.        Behavior: Behavior normal.  No results found for any visits on 02/14/20.     Assessment & Plan    1. Laryngitis  Counseled on testing for COVID. If unable, quarantine 5 more days for total of 10 days from start of symptoms. Will send in prednisone taper. Advised on ten day course of viral illness. She may message me on Monday 02/18/2020 if not feeling better and I will send in doxycycline 100 mg BID x 7 days. Advised on use of Sudafed x 3 days for symptomatic relief.   - predniSONE (DELTASONE) 10 MG tablet; Take 6 tablets on day 1, take 5 tablets on day 2 and so on until complete.  Dispense: 21 tablet; Refill:  0  2. Sinus congestion   I discussed the assessment and treatment plan with the patient. The patient was provided an opportunity to ask questions and all were answered. The patient agreed with the plan and demonstrated an understanding of the instructions.   The patient was advised to call back or seek an in-person evaluation if the symptoms worsen or if the condition fails to improve as anticipated.  I provided 15 minutes of non-face-to-face time during this encounter.  The entirety of the information documented in the History of Present Illness, Review of Systems and Physical Exam were personally obtained by me. Portions of this information were initially documented by [facility] and reviewed by me for thoroughness and accuracy.      Trinna Post, PA-C  [facility]

## 2020-04-18 ENCOUNTER — Other Ambulatory Visit: Payer: Self-pay

## 2020-04-18 ENCOUNTER — Ambulatory Visit (INDEPENDENT_AMBULATORY_CARE_PROVIDER_SITE_OTHER): Payer: Managed Care, Other (non HMO) | Admitting: Certified Nurse Midwife

## 2020-04-18 ENCOUNTER — Encounter: Payer: Self-pay | Admitting: Certified Nurse Midwife

## 2020-04-18 VITALS — BP 126/80 | Ht 65.0 in | Wt 191.0 lb

## 2020-04-18 DIAGNOSIS — Z131 Encounter for screening for diabetes mellitus: Secondary | ICD-10-CM

## 2020-04-18 DIAGNOSIS — Z13220 Encounter for screening for lipoid disorders: Secondary | ICD-10-CM

## 2020-04-18 DIAGNOSIS — Z01419 Encounter for gynecological examination (general) (routine) without abnormal findings: Secondary | ICD-10-CM | POA: Diagnosis not present

## 2020-04-18 DIAGNOSIS — Z124 Encounter for screening for malignant neoplasm of cervix: Secondary | ICD-10-CM | POA: Diagnosis not present

## 2020-04-18 NOTE — Progress Notes (Signed)
Gynecology Annual Exam  PCP: Trinna Post, PA-C  Chief Complaint:  Chief Complaint  Patient presents with  . Gynecologic Exam    History of Present Illness:Tamara Weeks is a 54 year old Caucasian/White female, G1 P1001, who presents for her annual exam. Her menses are absent since discontinuing her BCP in March 2017. Is not on any hormone replacement.  She has had no spotting. She denies hot flashes.  The patient's past medical history is notable for a history of hyperlipidemia, anxiety/depression, and GDM.Marland Kitchen  Since her last annual GYN exam dated 01/02/2019, she has gained 26# during the Covid pandemic. She received her second dose of the [place] vaccine yesterday.    She is not sexually active. She has been sexually active in the past but not currently.  Her most recent pap smear was obtained 09/09/2017  and was NIL Her most recent mammogram obtained on 11/26/2019  was normal.  There is no family history of breast cancer.  There is no family history of ovarian cancer.  The patient does not do monthly self breast exams. She has had a recent colonoscopy 08/04/2018. Results: adenomatous polyps. Next due in 3 years (2022) The patient does not smoke.  The patient has stopped drinking alhol in the last 5 weeks to try to lose weight.  The patient does not use illegal drugs.  The patient does exercise by walking 30 mnutes a day. Has started a new diet called OPTHVIA The patient may not get adequate calcium in her diet, and  she is taking a calcium supplement.  She has  had a recent cholesterol screen 2019  and it was borderline as was her Hemoglobin A1C (5.8%).     Review of Systems: Review of Systems  Constitutional: Negative for chills, fever and weight loss.  HENT: Negative for congestion, sinus pain and sore throat.   Eyes: Negative for blurred vision, pain and redness.  Respiratory: Negative for hemoptysis, shortness of breath and wheezing.   Cardiovascular:  Negative for chest pain, palpitations and leg swelling.  Gastrointestinal: Negative for abdominal pain, blood in stool, diarrhea, heartburn, nausea and vomiting.  Genitourinary: Negative for dysuria, frequency, hematuria and urgency.  Musculoskeletal: Negative for back pain, joint pain and myalgias.  Skin: Negative for itching and rash.  Neurological: Negative for dizziness, tingling and headaches.  Endo/Heme/Allergies: Positive for environmental allergies. Negative for polydipsia. Does not bruise/bleed easily.       Negative for hirsutism  Psychiatric/Behavioral: Positive for depression. The patient is not nervous/anxious and does not have insomnia.     Past Medical History:  Past Medical History:  Diagnosis Date  . Adenomatous polyps   . Allergy   . Anxiety   . Chicken pox   . Depression   . Gestational diabetes   . Hyperlipidemia   . Migraines   . Mononucleosis     Past Surgical History:  Past Surgical History:  Procedure Laterality Date  . CARPAL TUNNEL RELEASE    . CESAREAN SECTION  1997  . COLONOSCOPY WITH PROPOFOL N/A 08/04/2018   Procedure: COLONOSCOPY WITH PROPOFOL;  Surgeon: Jonathon Bellows, MD;  [facility] END[facility]COPY;  Service: Gastroenterology;  Laterality: N/A;  . TONSILLECTOMY AND ADENOIDECTOMY  1975    Family History:  Family History  Problem Relation Age of Onset  . Arthritis Mother   . Hypertension Mother   . Depression Mother   . Arthritis Maternal Grandmother   . Hyperlipidemia Maternal Grandmother   . Diabetes Maternal  Grandmother   . Hypertension Maternal Grandmother   . Stroke Maternal Grandfather   . Hyperlipidemia Maternal Grandfather   . Alzheimer's disease Maternal Grandfather   . Hyperlipidemia Paternal Grandmother   . Diabetes Paternal Grandmother   . Hypertension Paternal Grandmother   . Hyperlipidemia Paternal Grandfather   . Diabetes Father 51  . Hyperlipidemia Father   . Dementia Father   . Diabetes Maternal Aunt   . Heart disease  Maternal Aunt   . Hypertension Maternal Aunt     Social History:  Social History   Socioeconomic History  . Marital status: Married    Spouse name: Not on file  . Number of children: 1  . Years of education: 52  . Highest education level: Not on file  Occupational History  . Occupation: [employer]  Tobacco Use  . Smoking status: Never Smoker  . Smokeless tobacco: Never Used  Substance and Sexual Activity  . Alcohol use: Not Currently  . Drug use: No  . Sexual activity: Not Currently    Partners: Male  Other Topics Concern  . 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 Difficulty of Paying Living Expenses:   Food Insecurity:   . Worried About Charity fundraiser in the Last Year:   . Arboriculturist in the Last Year:   Transportation Needs:   . Film/video editor (Medical):   Marland Kitchen Lack of Transportation (Non-Medical):   Physical Activity:   . Days of Exercise per Week:   . Minutes of Exercise per Session:   Stress:   . Feeling of Stress :   Social Connections:   . Frequency of Communication with Friends and Family:   . Frequency of Social Gatherings with Friends and Family:   . Attends Religious Services:   . Active Member of Clubs or Organizations:   . Attends Archivist Meetings:   Marland Kitchen Marital Status:   Intimate Partner Violence:   . Fear of Current or Ex-Partner:   . Emotionally Abused:   Marland Kitchen Physically Abused:   . Sexually Abused:     Allergies:  Allergies  Allergen Reactions  . Penicillins Other (See Comments)    Medications: Current Outpatient Medications on File Prior to Visit  Medication Sig Dispense Refill  . Calcium Carb-Cholecalciferol (CALCIUM/VITAMIN D PO) Take by mouth daily.    . citalopram (CELEXA) 40 MG tablet TAKE 1 TABLET(40 MG) BY MOUTH DAILY 90 tablet 1  . clobetasol (OLUX) 0.05 % topical  foam APPLY EXTERNALLY TO THE AFFECTED AREA TWICE DAILY 50 g 0  . clonazePAM (KLONOPIN) 0.5 MG tablet Take 1 tablet (0.5 mg total) by mouth 2 (two) times daily as needed for anxiety. 20 tablet 0  . Misc Natural Products (OSTEO BI-FLEX ADV TRIPLE ST PO) Take by mouth.    . Multiple Vitamin (MULTIVITAMIN) tablet Take 1 tablet by mouth daily.     No current facility-administered medications on file prior to visit.     Physical Exam Vitals: BP 126/80   Ht 5\' 5"  (1.651 m)   Wt 191 lb (86.6 kg)   LMP 03/02/2016   BMI 31.78 kg/m   General: WF in  NAD HEENT: normocephalic, anicteric Neck: no thyroid enlargement, no palpable nodules, no cervical lymphadenopathy  Pulmonary: No increased work of breathing, CTAB Cardiovascular: RRR, without murmur  Breast:  Breast symmetrical, no tenderness, no palpable nodules or masses, no skin or nipple retraction present, no nipple discharge.  No axillary, infraclavicular or supraclavicular lymphadenopathy. Abdomen: Soft, non-tender, non-distended.  Umbilicus without lesions.  No hepatomegaly or masses palpable. No evidence of hernia. Genitourinary:  External: Normal external female genitalia.  Normal urethral meatus, normal Bartholin's and Skene's glands.    Vagina:  Atrophic changes requiring one digit vaginal exam   Cervix:  No bleeding, non-tender  Uterus: Anteverted, normal size, shape, and consistency, mobile, and non-tender  Adnexa: No adnexal masses, non-tender  Rectal: deferred  Lymphatic: no evidence of inguinal lymphadenopathy Extremities: no edema, erythema, or tenderness Neurologic: Grossly intact Psychiatric: mood appropriate, affect full     Assessment: 54 y.o. G1P1001 normal menopausal gyn exam  Plan:   1) Breast cancer screening - recommend monthly self breast exam monthly and annual mammograms. Mammogram is UTD, due in December  2) Colon cancer screening: Colonoscopy UTD. Next due in 2022  3) Cervical cancer screening - Pap was  done. ASCCP guidelines and rational discussed.  Patient opts for every 3 year screening screening interval  4) Contraception - menopausal >1 year. No longer needs contraception.  5) Routine healthcare maintenance including cholesterol and diabetes screening done today.    6)Discussed calcium and vitamin D3 requirements and encouraged to continue calcium and vitamin D3 supplement.  7) RTO 1 year and prn.  Dalia Heading, CNM

## 2020-04-19 ENCOUNTER — Encounter: Payer: Self-pay | Admitting: Certified Nurse Midwife

## 2020-04-19 LAB — LIPID PANEL WITH LDL/HDL RATIO
Cholesterol, Total: 196 mg/dL (ref 100–199)
HDL: 52 mg/dL (ref >39)
LDL Chol Calc (NIH): 125 mg/dL — ABNORMAL HIGH (ref 0–99)
LDL/HDL Ratio: 2.4 ratio (ref 0.0–3.2)
Triglycerides: 105 mg/dL (ref 0–149)
VLDL Cholesterol Cal: 19 mg/dL (ref 5–40)

## 2020-04-19 LAB — HGB A1C W/O EAG: Hgb A1c MFr Bld: 5.7 % — ABNORMAL HIGH (ref 4.8–5.6)

## 2020-04-23 LAB — IGP, APTIMA HPV: HPV Aptima: NEGATIVE

## 2020-05-16 ENCOUNTER — Other Ambulatory Visit: Payer: Self-pay | Admitting: Physician Assistant

## 2020-05-16 DIAGNOSIS — R238 Other skin changes: Secondary | ICD-10-CM

## 2020-05-24 ENCOUNTER — Other Ambulatory Visit: Payer: Self-pay | Admitting: Physician Assistant

## 2020-05-24 DIAGNOSIS — F418 Other specified anxiety disorders: Secondary | ICD-10-CM

## 2020-05-24 NOTE — Telephone Encounter (Signed)
Approved per protocol.  Requested Prescriptions  Pending Prescriptions Disp Refills  . citalopram (CELEXA) 40 MG tablet [Pharmacy Med Name: CITALOPRAM 40MG  TABLETS] 90 tablet 0    Sig: TAKE 1 TABLET(40 MG) BY MOUTH DAILY     Psychiatry:  Antidepressants - SSRI Passed - 05/24/2020  3:31 AM      Passed - Completed PHQ-2 or PHQ-9 in the last 360 days.      Passed - Valid encounter within last 6 months    Recent Outpatient Visits          3 months ago [place], [place], [place]   11 months ago Dysfunction of left eustachian tube   [facility] [place], Wendee Beavers, [place]   1 year ago Depression with anxiety   [facility] Trinna Post, [place]   1 year ago Annual physical exam   [facility] Carles Collet M, [place]   2 years ago [place], [place], [place]

## 2020-08-26 ENCOUNTER — Other Ambulatory Visit: Payer: Self-pay | Admitting: Physician Assistant

## 2020-08-26 DIAGNOSIS — F418 Other specified anxiety disorders: Secondary | ICD-10-CM

## 2020-08-26 NOTE — Telephone Encounter (Signed)
30 day courtesy refill, needs follow up before more refills.

## 2020-08-26 NOTE — Telephone Encounter (Signed)
Requested medication (s) are due for refill today: yes  Requested medication (s) are on the active medication list: yes   Last refill:  05/24/2020  Future visit scheduled:no  Notes to clinic:  overdue for 6 month follow up   Requested Prescriptions  Pending Prescriptions Disp Refills   citalopram (CELEXA) 40 MG tablet [Pharmacy Med Name: CITALOPRAM 40MG  TABLETS] 90 tablet 0    Sig: TAKE 1 TABLET(40 MG) BY MOUTH DAILY      Psychiatry:  Antidepressants - SSRI Failed - 08/26/2020  3:33 AM      Failed - Completed PHQ-2 or PHQ-9 in the last 360 days.      Failed - Valid encounter within last 6 months    Recent Outpatient Visits           6 months ago [place], [place], [place]   1 year ago Dysfunction of left eustachian tube   [facility] [place], Wendee Beavers, PA-C   1 year ago Depression with anxiety   [facility] Trinna Post, [place]   2 years ago Annual physical exam   [facility] [place] Carles Collet M, [place]   2 years ago [place], [place], [place]

## 2020-09-12 ENCOUNTER — Telehealth: Payer: Self-pay

## 2020-09-12 NOTE — Telephone Encounter (Signed)
Copied from [place] [id number]. Topic: Appointment Scheduling - Scheduling Inquiry for Clinic >> Sep 12, 2020 11:14 AM Erick Blinks wrote: Pt is requesting to have a colonoscopy, with Dr. Wilhemena Durie at [facility] contact [phone]

## 2020-09-12 NOTE — Telephone Encounter (Signed)
Patient advised and scheduled for physical.

## 2020-09-12 NOTE — Telephone Encounter (Signed)
She's actually not due yet, she is due next year. Though we would generally discuss this at a CPE and I see she hasn't had one. Recommend scheduling CPE and follow up as I haven't seen her for anxiety in > 1 year.

## 2020-11-04 NOTE — Progress Notes (Signed)
Complete physical exam   Patient: Tamara Weeks   DOB: 1966-09-06   54 y.o. Female  MRN: [id number] Visit Date: 11/05/2020  Today's healthcare provider: Trinna Post, PA-C   Chief Complaint  Patient presents with  . Annual Exam  . Depression  . Anxiety  I,Soni Kegel M Flemon Kelty,acting as a scribe for Trinna Post, PA-C.,have documented all relevant documentation on the behalf of Trinna Post, PA-C,as directed by  Trinna Post, PA-C while in the presence of Trinna Post, PA-C.  Subjective    Tamara Weeks is a 54 y.o. female who presents today for a complete physical exam.  She reports consuming a general diet. The patient does not participate in regular exercise at present. She generally feels fairly well. She reports sleeping fairly well. She does have additional problems to discuss today.  HPI  Depression&Anxiety, Follow-up  She was last seen for anxiety 17 months ago. Changes made at last visit include continue current medication.   She reports good compliance with treatment. She reports good tolerance of treatment. She is not having side effects.   She feels her anxiety is mild and Improved since last visit.  Symptoms: No chest pain No difficulty concentrating  No dizziness No fatigue  No feelings of losing control No insomnia  No irritable No palpitations  No panic attacks No racing thoughts  No shortness of breath No sweating  No tremors/shakes    GAD-7 Results GAD-7 Generalized Anxiety Disorder Screening Tool 11/05/2020  1. Feeling Nervous, Anxious, or on Edge 1  2. Not Being Able to Stop or Control Worrying 0  3. Worrying Too Much About Different Things 1  4. Trouble Relaxing 0  5. Being So Restless it's Hard To Sit Still 0  6. Becoming Easily Annoyed or Irritable 0  7. Feeling Afraid As If Something Awful Might Happen 0  Total GAD-7 Score 2  Difficulty At Work, Home, or Getting  Along With Others? Not difficult at all     PHQ-9 Scores PHQ9 SCORE ONLY 11/05/2020 06/01/2019 06/07/2018  PHQ-9 Total Score 1 0 1   Wt Readings from Last 3 Encounters:  11/05/20 178 lb 14.4 oz (81.1 kg)  04/18/20 191 lb (86.6 kg)  06/01/19 165 lb 12.8 oz (75.2 kg)    --------------------------------------------------------------------------------------------------- She notes hemorrhoids that are slightly painful and itchy. She has not had relief from preparation H.   Past Medical History:  Diagnosis Date  . Adenomatous polyps   . Allergy   . Anxiety   . Chicken pox   . Depression   . Gestational diabetes   . Hyperlipidemia   . Migraines   . Mononucleosis    Past Surgical History:  Procedure Laterality Date  . CARPAL TUNNEL RELEASE    . CESAREAN SECTION  1997  . COLONOSCOPY WITH PROPOFOL N/A 08/04/2018   Procedure: COLONOSCOPY WITH PROPOFOL;  Surgeon: Jonathon Bellows, MD;  Location: [facility] ENDOSCOPY;  Service: Gastroenterology;  Laterality: N/A;  . TONSILLECTOMY AND ADENOIDECTOMY  1975   Social History   Socioeconomic History  . Marital status: Married    Spouse name: Not on file  . Number of children: 1  . Years of education: 34  . Highest education level: Not on file  Occupational History  . Occupation: [employer]  Tobacco Use  . Smoking status: Never Smoker  . Smokeless tobacco: Never Used  Vaping Use  . Vaping Use: Never used  Substance and Sexual Activity  . Alcohol use:  Not Currently  . Drug use: No  . Sexual activity: Not Currently    Partners: Male  Other Topics Concern  . 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 Difficulty of Paying Living Expenses: Not on file  Food Insecurity:   . Worried About Charity fundraiser in the Last Year: Not on file  . Ran Out of Food in the Last Year: Not on file  Transportation Needs:   . Lack of Transportation (Medical):  Not on file  . Lack of Transportation (Non-Medical): Not on file  Physical Activity:   . Days of Exercise per Week: Not on file  . Minutes of Exercise per Session: Not on file  Stress:   . Feeling of Stress : Not on file  Social Connections:   . Frequency of Communication with Friends and Family: Not on file  . Frequency of Social Gatherings with Friends and Family: Not on file  . Attends Religious Services: Not on file  . Active Member of Clubs or Organizations: Not on file  . Attends Archivist Meetings: Not on file  . Marital Status: Not on file  Intimate Partner Violence:   . Fear of Current or Ex-Partner: Not on file  . Emotionally Abused: Not on file  . Physically Abused: Not on file  . Sexually Abused: Not on file   Family Status  Relation Name Status  . Mother  Alive, age 68y  . MGM  Deceased  . MGF  Deceased  . PGM  Deceased  . PGF  (Not Specified)  . Father  Alive, age 96y  . Mat Aunt  (Not Specified)   Family History  Problem Relation Age of Onset  . Arthritis Mother   . Hypertension Mother   . Depression Mother   . Arthritis Maternal Grandmother   . Hyperlipidemia Maternal Grandmother   . Diabetes Maternal Grandmother   . Hypertension Maternal Grandmother   . Stroke Maternal Grandfather   . Hyperlipidemia Maternal Grandfather   . Alzheimer's disease Maternal Grandfather   . Hyperlipidemia Paternal Grandmother   . Diabetes Paternal Grandmother   . Hypertension Paternal Grandmother   . Hyperlipidemia Paternal Grandfather   . Diabetes Father 73  . Hyperlipidemia Father   . Dementia Father   . Diabetes Maternal Aunt   . Heart disease Maternal Aunt   . Hypertension Maternal Aunt    Allergies  Allergen Reactions  . Penicillins Other (See Comments)    Patient Care Team: Paulene Floor as PCP - General (Physician Assistant)   Medications: Outpatient Medications Prior to Visit  Medication Sig  . Calcium Carb-Cholecalciferol  (CALCIUM/VITAMIN D PO) Take by mouth daily.  . clobetasol (OLUX) 0.05 % topical foam APPLY EXTERNALLY TO THE AFFECTED AREA TWICE DAILY  . clonazePAM (KLONOPIN) 0.5 MG tablet Take 1 tablet (0.5 mg total) by mouth 2 (two) times daily as needed for anxiety.  . Misc Natural Products (OSTEO BI-FLEX ADV TRIPLE ST PO) Take by mouth.  . Multiple Vitamin (MULTIVITAMIN) tablet Take 1 tablet by mouth daily.  . [DISCONTINUED] citalopram (CELEXA) 40 MG tablet TAKE 1 TABLET(40 MG) BY MOUTH DAILY   No facility-administered medications prior to visit.    Review of Systems  Constitutional: Negative.   HENT: Negative.   Eyes: Negative.   Respiratory: Negative.   Cardiovascular: Negative.  Gastrointestinal: Negative.   Endocrine: Negative.   Genitourinary: Negative.   Musculoskeletal: Negative.   Skin: Negative.   Allergic/Immunologic: Positive for environmental allergies.  Neurological: Negative.   Hematological: Negative.   Psychiatric/Behavioral: Negative.       Objective    BP (!) 116/58 (BP Location: Left Arm, Patient Position: Sitting, Cuff Size: Large)   Pulse 75   Temp 98.5 F (36.9 C) (Oral)   Ht 5\' 5"  (1.651 m)   Wt 178 lb 14.4 oz (81.1 kg)   LMP 03/02/2016   SpO2 99%   BMI 29.77 kg/m    Physical Exam Constitutional:      Appearance: Normal appearance.  HENT:     Right Ear: Tympanic membrane, ear canal and external ear normal.     Left Ear: Tympanic membrane, ear canal and external ear normal.  Cardiovascular:     Rate and Rhythm: Normal rate and regular rhythm.     Pulses: Normal pulses.     Heart sounds: Normal heart sounds.  Pulmonary:     Effort: Pulmonary effort is normal.     Breath sounds: Normal breath sounds.  Abdominal:     General: Abdomen is flat. Bowel sounds are normal.     Palpations: Abdomen is soft.  Skin:    General: Skin is warm and dry.  Neurological:     General: No focal deficit present.     Mental Status: She is alert and oriented to  person, place, and time.  Psychiatric:        Mood and Affect: Mood normal.        Behavior: Behavior normal.       Last depression screening scores PHQ 2/9 Scores 11/05/2020 06/01/2019 06/07/2018  PHQ - 2 Score 0 0 0  PHQ- 9 Score 1 0 1   Last fall risk screening Fall Risk  11/05/2020  Falls in the past year? 0  Number falls in past yr: 0  Injury with Fall? 0  Risk for fall due to : No Fall Risks  Follow up Falls evaluation completed   Last Audit-C alcohol use screening Alcohol Use Disorder Test (AUDIT) 11/05/2020  1. How often do you have a drink containing alcohol? 3  2. How many drinks containing alcohol do you have on a typical day when you are drinking? 0  3. How often do you have six or more drinks on one occasion? 0  AUDIT-C Score 3  4. How often during the last year have you found that you were not able to stop drinking once you had started? 0  5. How often during the last year have you failed to do what was normally expected from you because of drinking? 0  6. How often during the last year have you needed a first drink in the morning to get yourself going after a heavy drinking session? 0  7. How often during the last year have you had a feeling of guilt of remorse after drinking? 0  8. How often during the last year have you been unable to remember what happened the night before because you had been drinking? 0  9. Have you or someone else been injured as a result of your drinking? 0  10. Has a relative or friend or a doctor or another health worker been concerned about your drinking or suggested you cut down? 0  Alcohol Use Disorder Identification Test Final Score (AUDIT) 3   A score of 3 or more in women, and 4  or more in men indicates increased risk for alcohol abuse, EXCEPT if all of the points are from question 1   No results found for any visits on 11/05/20.  Assessment & Plan    Routine Health Maintenance and Physical Exam  Exercise Activities and Dietary  recommendations Goals   None     Immunization History  Administered Date(s) Administered  . Influenza Inj Mdck Quad Pf 12/31/2017, 09/08/2019  . Influenza,inj,Quad PF,6+ Mos 01/02/2019  . Influenza-Unspecified 09/12/2016  . PFIZER SARS-COV-2 Vaccination 03/27/2020, 04/17/2020    Health Maintenance  Topic Date Due  . Hepatitis C Screening  Never done  . INFLUENZA VACCINE  03/19/2021 (Originally 07/20/2020)  . TETANUS/TDAP  04/18/2021 (Originally 02/20/1985)  . MAMMOGRAM  11/25/2020  . COLONOSCOPY  08/04/2021  . PAP SMEAR-Modifier  04/18/2025  . COVID-19 Vaccine  Completed    Discussed health benefits of physical activity, and encouraged her to engage in regular exercise appropriate for her age and condition.  1. Annual physical exam  - TSH - Lipid panel - Comprehensive metabolic panel - CBC with Differential/Platelet  2. Need for hepatitis C screening test  - Hepatitis C antibody  3. Encounter for screening mammogram for malignant neoplasm of breast  - MM Digital Screening; Future  4. Polyp of colon, unspecified part of colon, unspecified type   5. Prediabetes  - HgB A1c  6. Depression with anxiety  - citalopram (CELEXA) 40 MG tablet; TAKE 1 TABLET(40 MG) BY MOUTH DAILY  Dispense: 90 tablet; Refill: 3  7. Hemorrhoids, unspecified hemorrhoid type  - hydrocortisone (ANUSOL-HC) 2.5 % rectal cream; Place 1 application rectally 2 (two) times daily.  Dispense: 30 g; Refill: 0   Return in 1 year (on 11/05/2021) for CPE and f/u .     ITrinna Post, PA-C, have reviewed all documentation for this visit. The documentation on 11/06/20 for the exam, diagnosis, procedures, and orders are all accurate and complete.  The entirety of the information documented in the History of Present Illness, Review of Systems and Physical Exam were personally obtained by me. Portions of this information were initially documented by [facility] and reviewed by me for thoroughness  and accuracy.     Paulene Floor  [facility] [phone] (phone) [phone] (fax)  [place]

## 2020-11-05 ENCOUNTER — Ambulatory Visit (INDEPENDENT_AMBULATORY_CARE_PROVIDER_SITE_OTHER): Payer: Managed Care, Other (non HMO) | Admitting: Physician Assistant

## 2020-11-05 ENCOUNTER — Encounter: Payer: Self-pay | Admitting: Physician Assistant

## 2020-11-05 ENCOUNTER — Other Ambulatory Visit: Payer: Self-pay

## 2020-11-05 VITALS — BP 116/58 | HR 75 | Temp 98.5°F | Ht 65.0 in | Wt 178.9 lb

## 2020-11-05 DIAGNOSIS — Z0000 Encounter for general adult medical examination without abnormal findings: Secondary | ICD-10-CM

## 2020-11-05 DIAGNOSIS — Z1159 Encounter for screening for other viral diseases: Secondary | ICD-10-CM

## 2020-11-05 DIAGNOSIS — R7303 Prediabetes: Secondary | ICD-10-CM

## 2020-11-05 DIAGNOSIS — K635 Polyp of colon: Secondary | ICD-10-CM | POA: Diagnosis not present

## 2020-11-05 DIAGNOSIS — K649 Unspecified hemorrhoids: Secondary | ICD-10-CM | POA: Diagnosis not present

## 2020-11-05 DIAGNOSIS — F418 Other specified anxiety disorders: Secondary | ICD-10-CM

## 2020-11-05 DIAGNOSIS — Z1231 Encounter for screening mammogram for malignant neoplasm of breast: Secondary | ICD-10-CM

## 2020-11-05 MED ORDER — CITALOPRAM HYDROBROMIDE 40 MG PO TABS: ORAL_TABLET | ORAL | 3 refills | Status: DC

## 2020-11-05 MED ORDER — HYDROCORTISONE (PERIANAL) 2.5 % EX CREA: 1.0000 "application " | TOPICAL_CREAM | Freq: Two times a day (BID) | CUTANEOUS | 0 refills | Status: AC

## 2020-11-05 NOTE — Patient Instructions (Addendum)
Ask insurance company about Shingrix for shingles    Health Maintenance, Female Adopting a healthy lifestyle and getting preventive care are important in promoting health and wellness. Ask your health care provider about:  The right schedule for you to have regular tests and exams.  Things you can do on your own to prevent diseases and keep yourself healthy. What should I know about diet, weight, and exercise? Eat a healthy diet   Eat a diet that includes plenty of vegetables, fruits, low-fat dairy products, and lean protein.  Do not eat a lot of foods that are high in solid fats, added sugars, or sodium. Maintain a healthy weight Body mass index (BMI) is used to identify weight problems. It estimates body fat based on height and weight. Your health care provider can help determine your BMI and help you achieve or maintain a healthy weight. Get regular exercise Get regular exercise. This is one of the most important things you can do for your health. Most adults should:  Exercise for at least 150 minutes each week. The exercise should increase your heart rate and make you sweat (moderate-intensity exercise).  Do strengthening exercises at least twice a week. This is in addition to the moderate-intensity exercise.  Spend less time sitting. Even light physical activity can be beneficial. Watch cholesterol and blood lipids Have your blood tested for lipids and cholesterol at 54 years of age, then have this test every 5 years. Have your cholesterol levels checked more often if:  Your lipid or cholesterol levels are high.  You are older than 54 years of age.  You are at high risk for heart disease. What should I know about cancer screening? Depending on your health history and family history, you may need to have cancer screening at various ages. This may include screening for:  Breast cancer.  Cervical cancer.  Colorectal cancer.  Skin cancer.  Lung cancer. What should I  know about heart disease, diabetes, and high blood pressure? Blood pressure and heart disease  High blood pressure causes heart disease and increases the risk of stroke. This is more likely to develop in people who have high blood pressure readings, are of African descent, or are overweight.  Have your blood pressure checked: ? Every 3-5 years if you are 83-14 years of age. ? Every year if you are 21 years old or older. Diabetes Have regular diabetes screenings. This checks your fasting blood sugar level. Have the screening done:  Once every three years after age 45 if you are at a normal weight and have a low risk for diabetes.  More often and at a younger age if you are overweight or have a high risk for diabetes. What should I know about preventing infection? Hepatitis B If you have a higher risk for hepatitis B, you should be screened for this virus. Talk with your health care provider to find out if you are at risk for hepatitis B infection. Hepatitis C Testing is recommended for:  Everyone born from [birth year] through 1965.  Anyone with known risk factors for hepatitis C. Sexually transmitted infections (STIs)  Get screened for STIs, including gonorrhea and chlamydia, if: ? You are sexually active and are younger than 54 years of age. ? You are older than 54 years of age and your health care provider tells you that you are at risk for this type of infection. ? Your sexual activity has changed since you were last screened, and you are at increased risk  for chlamydia or gonorrhea. Ask your health care provider if you are at risk.  Ask your health care provider about whether you are at high risk for HIV. Your health care provider may recommend a prescription medicine to help prevent HIV infection. If you choose to take medicine to prevent HIV, you should first get tested for HIV. You should then be tested every 3 months for as long as you are taking the medicine. Pregnancy  If you are  about to stop having your period (premenopausal) and you may become pregnant, seek counseling before you get pregnant.  Take 400 to 800 micrograms (mcg) of folic acid every day if you become pregnant.  Ask for birth control (contraception) if you want to prevent pregnancy. Osteoporosis and menopause Osteoporosis is a disease in which the bones lose minerals and strength with aging. This can result in bone fractures. If you are 20 years old or older, or if you are at risk for osteoporosis and fractures, ask your health care provider if you should:  Be screened for bone loss.  Take a calcium or vitamin D supplement to lower your risk of fractures.  Be given hormone replacement therapy (HRT) to treat symptoms of menopause. Follow these instructions at home: Lifestyle  Do not use any products that contain nicotine or tobacco, such as cigarettes, e-cigarettes, and chewing tobacco. If you need help quitting, ask your health care provider.  Do not use street drugs.  Do not share needles.  Ask your health care provider for help if you need support or information about quitting drugs. Alcohol use  Do not drink alcohol if: ? Your health care provider tells you not to drink. ? You are pregnant, may be pregnant, or are planning to become pregnant.  If you drink alcohol: ? Limit how much you use to 0-1 drink a day. ? Limit intake if you are breastfeeding.  Be aware of how much alcohol is in your drink. In the [place] one drink equals one 12 oz bottle of beer (355 mL), one 5 oz glass of wine (148 mL), or one 1 oz glass of hard liquor (44 mL). General instructions  Schedule regular health, dental, and eye exams.  Stay current with your vaccines.  Tell your health care provider if: ? You often feel depressed. ? You have ever been abused or do not feel safe at home. Summary  Adopting a healthy lifestyle and getting preventive care are important in promoting health and wellness.  Follow  your health care provider's instructions about healthy diet, exercising, and getting tested or screened for diseases.  Follow your health care provider's instructions on monitoring your cholesterol and blood pressure. This information is not intended to replace advice given to you by your health care provider. Make sure you discuss any questions you have with your health care provider. Document Revised: 11/29/2018 Document Reviewed: 11/29/2018 Elsevier Patient Education  2020 [employer].

## 2021-03-05 LAB — HM MAMMOGRAPHY

## 2021-04-23 ENCOUNTER — Other Ambulatory Visit: Payer: Self-pay

## 2021-04-23 ENCOUNTER — Encounter: Payer: Self-pay | Admitting: Advanced Practice Midwife

## 2021-04-23 ENCOUNTER — Ambulatory Visit (INDEPENDENT_AMBULATORY_CARE_PROVIDER_SITE_OTHER): Payer: Managed Care, Other (non HMO) | Admitting: Advanced Practice Midwife

## 2021-04-23 VITALS — BP 122/86 | Ht 65.0 in | Wt 187.0 lb

## 2021-04-23 DIAGNOSIS — Z0000 Encounter for general adult medical examination without abnormal findings: Secondary | ICD-10-CM

## 2021-04-23 DIAGNOSIS — F419 Anxiety disorder, unspecified: Secondary | ICD-10-CM

## 2021-04-23 MED ORDER — CLONAZEPAM 0.5 MG PO TABS: 0.5000 mg | ORAL_TABLET | Freq: Two times a day (BID) | ORAL | 0 refills | Status: AC | PRN

## 2021-04-23 NOTE — Progress Notes (Signed)
Gynecology Annual Exam  PCP: Trinna Post, PA-C  Chief Complaint:  Chief Complaint  Patient presents with  . Gynecologic Exam    Annual - no concerns. RM 5    History of Present Illness:Patient is a 55 y.o. G1P1001 presents for annual exam. The patient has no gyn complaints today. She is under an increased level of situational stress and requests a refill of Klonopin. She rarely takes this medication. Last order was 4 years ago.  LMP: Patient's last menstrual period was 03/02/2016.  Postcoital Bleeding: no Dysmenorrhea: not applicable  The patient is sexually active. She denies dyspareunia.  The patient does perform self breast exams.  There is no notable family history of breast or ovarian cancer in her family.  The patient wears seatbelts: yes.   The patient has regular exercise: she denies regular exercise at this time, she admits healthy lifestyle diet, hydration, and sleep.    The patient denies current symptoms of depression.  Her symptoms are controlled with Celexa.    Review of Systems: Review of Systems  Constitutional: Negative for chills and fever.  HENT: Negative for congestion, ear discharge, ear pain, hearing loss, sinus pain and sore throat.   Eyes: Negative for blurred vision and double vision.  Respiratory: Negative for cough, shortness of breath and wheezing.   Cardiovascular: Negative for chest pain, palpitations and leg swelling.  Gastrointestinal: Negative for abdominal pain, blood in stool, constipation, diarrhea, heartburn, melena, nausea and vomiting.  Genitourinary: Negative for dysuria, flank pain, frequency, hematuria and urgency.  Musculoskeletal: Negative for back pain, joint pain and myalgias.  Skin: Negative for itching and rash.  Neurological: Negative for dizziness, tingling, tremors, sensory change, speech change, focal weakness, seizures, loss of consciousness, weakness and headaches.  Endo/Heme/Allergies: Negative for environmental  allergies. Does not bruise/bleed easily.       Positive for hot flashes  Psychiatric/Behavioral: Negative for depression, hallucinations, memory loss, substance abuse and suicidal ideas. The patient is not nervous/anxious and does not have insomnia.        Positive for anxiety and depression    Past Medical History:  Patient Active Problem List   Diagnosis Date Noted  . Adenomatous polyps 01/02/2019    08/04/18 colonoscopy. Next colonoscopy due in 3 years   . Annual physical exam 06/07/2017  . Palpitations 09/15/2016  . Depression with anxiety 12/02/2015    Past Surgical History:  Past Surgical History:  Procedure Laterality Date  . CARPAL TUNNEL RELEASE    . CESAREAN SECTION  1997  . COLONOSCOPY WITH PROPOFOL N/A 08/04/2018   Procedure: COLONOSCOPY WITH PROPOFOL;  Surgeon: Jonathon Bellows, MD;  Location: [facility] ENDOSCOPY;  Service: Gastroenterology;  Laterality: N/A;  . TONSILLECTOMY AND ADENOIDECTOMY  1975    Gynecologic History:  Patient's last menstrual period was 03/02/2016. Last Pap: 1 year ago Results were:  no abnormalities  Last mammogram: 2 months ago Results were: BI-RAD I  Obstetric History: G1P1001  Family History:  Family History  Problem Relation Age of Onset  . Arthritis Mother   . Hypertension Mother   . Depression Mother   . Arthritis Maternal Grandmother   . Hyperlipidemia Maternal Grandmother   . Diabetes Maternal Grandmother   . Hypertension Maternal Grandmother   . Stroke Maternal Grandfather   . Hyperlipidemia Maternal Grandfather   . Alzheimer's disease Maternal Grandfather   . Hyperlipidemia Paternal Grandmother   . Diabetes Paternal Grandmother   . Hypertension Paternal Grandmother   . Hyperlipidemia Paternal Grandfather   .  Diabetes Father 73  . Hyperlipidemia Father   . Dementia Father   . Diabetes Maternal Aunt   . Heart disease Maternal Aunt   . Hypertension Maternal Aunt     Social History:  Social History   Socioeconomic History   . Marital status: Married    Spouse name: Not on file  . Number of children: 1  . Years of education: 39  . Highest education level: Not on file  Occupational History  . Occupation: [employer]  Tobacco Use  . Smoking status: Never Smoker  . Smokeless tobacco: Never Used  Vaping Use  . Vaping Use: Never used  Substance and Sexual Activity  . Alcohol use: Not Currently  . Drug use: No  . Sexual activity: Not Currently    Partners: Male  Other Topics Concern  . 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 Penicillins Other (See Comments)    Medications: Prior to Admission medications   Medication Sig Start Date End Date Taking? Authorizing Provider  Calcium Carb-Cholecalciferol (CALCIUM/VITAMIN D PO) Take by mouth daily.   Yes [provider]  citalopram (CELEXA) 40 MG tablet TAKE 1 TABLET(40 MG) BY MOUTH DAILY 11/05/20  Yes Pollak, Adriana M, PA-C  clobetasol (OLUX) 0.05 % topical foam APPLY EXTERNALLY TO THE AFFECTED AREA TWICE DAILY 05/16/20  Yes Carles Collet M, PA-C  fluocinonide (LIDEX) 0.05 % external solution Apply topically. 04/09/21  Yes [provider]  hydrocortisone (ANUSOL-HC) 2.5 % rectal cream Place 1 application rectally 2 (two) times daily. 11/05/20  Yes Pollak, Wendee Beavers, PA-C  Misc Natural Products (OSTEO BI-FLEX ADV TRIPLE ST PO) Take by mouth.   Yes [provider]  Multiple Vitamin (MULTIVITAMIN) tablet Take 1 tablet by mouth daily.   Yes [provider]  olopatadine (PATANOL) 0.1 % ophthalmic solution SMARTSIG:In Eye(s) 04/19/21  Yes  [provider]  clonazePAM (KLONOPIN) 0.5 MG tablet Take 1 tablet (0.5 mg total) by mouth 2 (two) times daily as needed for anxiety. 04/23/21   Rod Can, CNM    Physical Exam Vitals: Blood pressure 122/86, height 5\' 5"  (1.651 m), weight 187 lb (84.8 kg), last menstrual period 03/02/2016.  General: NAD HEENT: normocephalic, anicteric Thyroid: no enlargement, no palpable nodules Pulmonary: No increased work of breathing, CTAB Cardiovascular: RRR, distal pulses 2+ Breast: Breast symmetrical, no tenderness, no palpable nodules or masses, no skin or nipple retraction present, no nipple discharge.  No axillary or supraclavicular lymphadenopathy. Abdomen: NABS, soft, non-tender, non-distended.  Umbilicus without lesions.  No hepatomegaly, splenomegaly or masses palpable. No evidence of hernia  Genitourinary: deferred for no concerns/PAP interval Extremities: no edema, erythema, or tenderness Neurologic: Grossly intact Psychiatric: mood appropriate, affect full     Assessment: 55 y.o. G1P1001 routine annual exam  Plan: Problem List Items Addressed This Visit   None   Visit Diagnoses    Well woman exam without gynecological exam    -  Primary   Anxiety       Relevant Medications   clonazePAM (KLONOPIN) 0.5 MG tablet  1) Mammogram - recommend yearly screening mammogram.  Mammogram Is up to date: Referral comes from PCP  2) STI screening  was offered and declined  3) ASCCP guidelines and rationale discussed.  Patient opts for every 5 years screening interval. Due in 2026  4) Osteoporosis  - per USPTF routine screening DEXA at age 28  Consider FDA-approved medical therapies in postmenopausal women and men aged 5 years and older, based on the following: a) A hip or vertebral (clinical or morphometric) fracture b) T-score ? -2.5 at the femoral neck or spine after appropriate evaluation to exclude secondary causes C) Low bone mass (T-score between -1.0 and -2.5 at  the femoral neck or spine) and a 10-year probability of a hip fracture ? 3% or a 10-year probability of a major osteoporosis-related fracture ? 20% based on the [place]-adapted WHO algorithm   5) Routine healthcare maintenance including cholesterol, diabetes screening discussed managed by PCP  6) Colonoscopy is due this year.  Screening recommended starting at age 43 for average risk individuals, age 6 for individuals deemed at increased risk (including African Americans) and recommended to continue until age 53.  For patient age 27-85 individualized approach is recommended.  Gold standard screening is via colonoscopy, Cologuard screening is an acceptable alternative for patient unwilling or unable to undergo colonoscopy.  "Colorectal cancer screening for average?risk adults: 2018 guideline update from the [place]: A Cancer Journal for Clinicians: May 18, 2017   7) Return in about 1 year (around 04/23/2022) for annual established gyn.   Christean Leaf, CNM [facility] 04/23/21, 4:35 PM

## 2021-06-15 ENCOUNTER — Other Ambulatory Visit: Payer: Self-pay

## 2021-06-15 ENCOUNTER — Encounter: Payer: Self-pay | Admitting: Internal Medicine

## 2021-06-15 ENCOUNTER — Ambulatory Visit: Payer: Managed Care, Other (non HMO) | Admitting: Internal Medicine

## 2021-06-15 ENCOUNTER — Other Ambulatory Visit: Payer: Self-pay | Admitting: Internal Medicine

## 2021-06-15 VITALS — BP 129/85 | HR 76 | Temp 99.0°F | Ht 65.12 in | Wt 187.1 lb

## 2021-06-15 DIAGNOSIS — M545 Low back pain, unspecified: Secondary | ICD-10-CM

## 2021-06-15 DIAGNOSIS — R200 Anesthesia of skin: Secondary | ICD-10-CM | POA: Diagnosis not present

## 2021-06-15 DIAGNOSIS — M7918 Myalgia, other site: Secondary | ICD-10-CM | POA: Diagnosis not present

## 2021-06-15 LAB — BAYER DCA HB A1C WAIVED: HB A1C (BAYER DCA - WAIVED): 5.7 % (ref <7.0)

## 2021-06-15 MED ORDER — DICLOFENAC SODIUM 1 % EX GEL: 2.0000 g | Freq: Four times a day (QID) | CUTANEOUS | 1 refills | Status: AC

## 2021-06-15 MED ORDER — LIDOCAINE 5 % EX PTCH: 1.0000 | MEDICATED_PATCH | CUTANEOUS | 0 refills | Status: DC

## 2021-06-15 NOTE — Progress Notes (Signed)
BP 129/85   Pulse 76   Temp 99 F (37.2 C) (Oral)   Ht 5' 5.12" (1.654 m)   Wt 187 lb 1.6 oz (84.9 kg)   LMP 03/02/2016   SpO2 99%   BMI 31.02 kg/m    Subjective:    Patient ID: Tamara Weeks, female    DOB: 12/13/1966, 55 y.o.   MRN: [id number]  Chief Complaint  Patient presents with   Leg Pain    Right leg pain for the past few weeks, feels like something is pulling in the thigh   Foot Swelling    Left inside of foot since yesterday.   Hand Pain    Left middle finder pain at the base of finger, has been painful and swollen since last week    HPI: Tamara Weeks is a 55 y.o. female  Leg Pain  Incident onset: pt has been sitting a lot as she works from home. has pain in the middle/ inner thigh and feels like her muslce was pulling at night didnt give her relief sec to pain. There was no injury mechanism. The pain is mild. Pertinent negatives include no inability to bear weight, loss of motion, loss of sensation, muscle weakness, numbness or tingling. Associated symptoms comments: Worse when she gets up from a seated position the area on the inner thigh  Has some pain on the left paraspinal ms area. .  Hand Pain  Pertinent negatives include no muscle weakness, numbness or tingling.   Chief Complaint  Patient presents with   Leg Pain    Right leg pain for the past few weeks, feels like something is pulling in the thigh   Foot Swelling    Left inside of foot since yesterday.   Hand Pain    Left middle finder pain at the base of finger, has been painful and swollen since last week    Relevant past medical, surgical, family and social history reviewed and updated as indicated. Interim medical history since our last visit reviewed. Allergies and medications reviewed and updated.  Review of Systems  Neurological:  Negative for tingling and numbness.   Per HPI unless specifically indicated above     Objective:    BP 129/85   Pulse 76   Temp 99 F (37.2  C) (Oral)   Ht 5' 5.12" (1.654 m)   Wt 187 lb 1.6 oz (84.9 kg)   LMP 03/02/2016   SpO2 99%   BMI 31.02 kg/m   Wt Readings from Last 3 Encounters:  06/15/21 187 lb 1.6 oz (84.9 kg)  04/23/21 187 lb (84.8 kg)  11/05/20 178 lb 14.4 oz (81.1 kg)    Physical Exam  Results for orders placed or performed in visit on 03/06/21  [facility] MAMMOGRAPHY  Result Value Ref Range   [facility] Mammogram 0-4 Bi-Rad 0-4 Bi-Rad, Self Reported Normal        Current Outpatient Medications:    Calcium Carb-Cholecalciferol (CALCIUM/VITAMIN D PO), Take by mouth daily., Disp: , Rfl:    citalopram (CELEXA) 40 MG tablet, TAKE 1 TABLET(40 MG) BY MOUTH DAILY, Disp: 90 tablet, Rfl: 3   clobetasol (OLUX) 0.05 % topical foam, APPLY EXTERNALLY TO THE AFFECTED AREA TWICE DAILY, Disp: 50 g, Rfl: 0   clonazePAM (KLONOPIN) 0.5 MG tablet, Take 1 tablet (0.5 mg total) by mouth 2 (two) times daily as needed for anxiety., Disp: 20 tablet, Rfl: 0   fluocinonide (LIDEX) 0.05 % external solution, Apply topically., Disp: , Rfl:  hydrocortisone (ANUSOL-HC) 2.5 % rectal cream, Place 1 application rectally 2 (two) times daily., Disp: 30 g, Rfl: 0   Misc Natural Products (OSTEO BI-FLEX ADV TRIPLE ST PO), Take by mouth., Disp: , Rfl:    Multiple Vitamin (MULTIVITAMIN) tablet, Take 1 tablet by mouth daily., Disp: , Rfl:    olopatadine (PATANOL) 0.1 % ophthalmic solution, SMARTSIG:In Eye(s), Disp: , Rfl:     Assessment & Plan:  Right lower leg pain  - will start pt on lidoderm patches/ voltaren gel for pain  Pt has gained weight advised to use loose fitted jeans/ clothings to prevent nerve compression  To use otc pain relief  2. Back pain :  probably secondary to muscle spasms  will need ? MRI / xrays of back if pain persists. most likely musckulskelteal though  adviced streches for back  Patient advised to take medication as directed here. Patient advised to rest initially and then slowly increase activity level. Monitor changes in  symptoms such as numbness, tingling or weakness in legs, changes in bowel or bladder habits or worsening back pain. Proper ergonomics discussed. Referral to physical therapy as needed. Patient will call if symptoms worsen or if pain persists greater than 8 weeks.   3. Numbness in lower ext ho prediabetes Check a1c today  Problem List Items Addressed This Visit   None    Orders Placed This Encounter  Procedures   Bayer DCA Hb A1c Waived   Basic metabolic panel     Meds ordered this encounter  Medications   lidocaine (LIDODERM) 5 %    Sig: Place 1 patch onto the skin daily. Remove & Discard patch within 12 hours or as directed by MD    Dispense:  30 patch    Refill:  0   diclofenac Sodium (VOLTAREN) 1 % GEL    Sig: Apply 2 g topically 4 (four) times daily.    Dispense:  2 g    Refill:  1     Follow up plan: No follow-ups on file.

## 2021-06-15 NOTE — Patient Instructions (Signed)
Back Exercises These exercises help to make your trunk and back strong. They also help to keep the lower back flexible. Doing these exercises can help to prevent back pain or lessen existing pain. If you have back pain, try to do these exercises 2-3 times each day or as told by your doctor. As you get better, do the exercises once each day. Repeat the exercises more often as told by your doctor. To stop back pain from coming back, do the exercises once each day, or as told by your doctor. Exercises Single knee to chest Do these steps 3-5 times in a row for each leg: Lie on your back on a firm bed or the floor with your legs stretched out. Bring one knee to your chest. Grab your knee or thigh with both hands and hold them it in place. Pull on your knee until you feel a gentle stretch in your lower back or buttocks. Keep doing the stretch for 10-30 seconds. Slowly let go of your leg and straighten it. Pelvic tilt Do these steps 5-10 times in a row: Lie on your back on a firm bed or the floor with your legs stretched out. Bend your knees so they point up to the ceiling. Your feet should be flat on the floor. Tighten your lower belly (abdomen) muscles to press your lower back against the floor. This will make your tailbone point up to the ceiling instead of pointing down to your feet or the floor. Stay in this position for 5-10 seconds while you gently tighten your muscles and breathe evenly. Cat-cow Do these steps until your lower back bends more easily: Get on your hands and knees on a firm surface. Keep your hands under your shoulders, and keep your knees under your hips. You may put padding under your knees. Let your head hang down toward your chest. Tighten (contract) the muscles in your belly. Point your tailbone toward the floor so your lower back becomes rounded like the back of a cat. Stay in this position for 5 seconds. Slowly lift your head. Let the muscles of your belly relax. Point  your tailbone up toward the ceiling so your back forms a sagging arch like the back of a cow. Stay in this position for 5 seconds.  Press-ups Do these steps 5-10 times in a row: Lie on your belly (face-down) on the floor. Place your hands near your head, about shoulder-width apart. While you keep your back relaxed and keep your hips on the floor, slowly straighten your arms to raise the top half of your body and lift your shoulders. Do not use your back muscles. You may change where you place your hands in order to make yourself more comfortable. Stay in this position for 5 seconds. Slowly return to lying flat on the floor.  Bridges Do these steps 10 times in a row: Lie on your back on a firm surface. Bend your knees so they point up to the ceiling. Your feet should be flat on the floor. Your arms should be flat at your sides, next to your body. Tighten your butt muscles and lift your butt off the floor until your waist is almost as high as your knees. If you do not feel the muscles working in your butt and the back of your thighs, slide your feet 1-2 inches farther away from your butt. Stay in this position for 3-5 seconds. Slowly lower your butt to the floor, and let your butt muscles relax. If  this exercise is too easy, try doing it with your arms crossed over yourchest. Belly crunches Do these steps 5-10 times in a row: Lie on your back on a firm bed or the floor with your legs stretched out. Bend your knees so they point up to the ceiling. Your feet should be flat on the floor. Cross your arms over your chest. Tip your chin a little bit toward your chest but do not bend your neck. Tighten your belly muscles and slowly raise your chest just enough to lift your shoulder blades a tiny bit off of the floor. Avoid raising your body higher than that, because it can put too much stress on your low back. Slowly lower your chest and your head to the floor. Back lifts Do these steps 5-10 times  in a row: Lie on your belly (face-down) with your arms at your sides, and rest your forehead on the floor. Tighten the muscles in your legs and your butt. Slowly lift your chest off of the floor while you keep your hips on the floor. Keep the back of your head in line with the curve in your back. Look at the floor while you do this. Stay in this position for 3-5 seconds. Slowly lower your chest and your face to the floor. Contact a doctor if: Your back pain gets a lot worse when you do an exercise. Your back pain does not get better 2 hours after you exercise. If you have any of these problems, stop doing the exercises. Do not do them again unless your doctor says it is okay. Get help right away if: You have sudden, very bad back pain. If this happens, stop doing the exercises. Do not do them again unless your doctor says it is okay. This information is not intended to replace advice given to you by your health care provider. Make sure you discuss any questions you have with your healthcare provider. Document Revised: 08/31/2018 Document Reviewed: 08/31/2018 Elsevier Patient Education  2022 [employer].

## 2021-06-16 LAB — BASIC METABOLIC PANEL
BUN/Creatinine Ratio: 12 (ref 9–23)
BUN: 8 mg/dL (ref 6–24)
CO2: 26 mmol/L (ref 20–29)
Calcium: 10.2 mg/dL (ref 8.7–10.2)
Chloride: 96 mmol/L (ref 96–106)
Creatinine, Ser: 0.66 mg/dL (ref 0.57–1.00)
Glucose: 81 mg/dL (ref 65–99)
Potassium: 5.2 mmol/L (ref 3.5–5.2)
Sodium: 138 mmol/L (ref 134–144)
eGFR: 104 mL/min/{1.73_m2} (ref >59)

## 2021-06-17 ENCOUNTER — Encounter: Payer: Self-pay | Admitting: Internal Medicine

## 2021-06-17 NOTE — Telephone Encounter (Signed)
Pl let her know this was done for the problmes she was seen for. Insurance will not cover other tests if not indicated. Thanks.

## 2021-06-23 NOTE — Telephone Encounter (Signed)
She will need to fu with her pcp for her other medical problems including any other tests she needs. Can you see why shes asking for these additional tests thnx.

## 2021-06-23 NOTE — Telephone Encounter (Signed)
Patient verbalized understanding  

## 2021-06-23 NOTE — Telephone Encounter (Signed)
Called patient and explained that the reason why she did not get all the lab work she thought she was getting is because she was not seen for that particular problem that day therefore Ins. will likely not pay for that lab work

## 2021-11-04 ENCOUNTER — Ambulatory Visit: Payer: Managed Care, Other (non HMO) | Admitting: Family Medicine

## 2021-11-05 ENCOUNTER — Encounter: Payer: Self-pay | Admitting: Physician Assistant

## 2021-11-10 ENCOUNTER — Encounter: Payer: Self-pay | Admitting: Family Medicine

## 2021-11-10 ENCOUNTER — Telehealth (INDEPENDENT_AMBULATORY_CARE_PROVIDER_SITE_OTHER): Payer: Managed Care, Other (non HMO) | Admitting: Family Medicine

## 2021-11-10 DIAGNOSIS — D369 Benign neoplasm, unspecified site: Secondary | ICD-10-CM

## 2021-11-10 DIAGNOSIS — F418 Other specified anxiety disorders: Secondary | ICD-10-CM

## 2021-11-10 DIAGNOSIS — M25551 Pain in right hip: Secondary | ICD-10-CM | POA: Diagnosis not present

## 2021-11-10 MED ORDER — PREDNISONE 10 MG PO TABS: ORAL_TABLET | ORAL | 0 refills | Status: DC

## 2021-11-10 MED ORDER — LIDOCAINE 5 % EX PTCH: 1.0000 | MEDICATED_PATCH | CUTANEOUS | 0 refills | Status: AC

## 2021-11-10 MED ORDER — CITALOPRAM HYDROBROMIDE 40 MG PO TABS: ORAL_TABLET | ORAL | 1 refills | Status: DC

## 2021-11-10 NOTE — Progress Notes (Signed)
MyChart Video Visit    Virtual Visit via Video Note   This visit type was conducted due to national recommendations for restrictions regarding the COVID-19 Pandemic (e.g. social distancing) in an effort to limit this patient's exposure and mitigate transmission in our community. This patient is at least at moderate risk for complications without adequate follow up. This format is felt to be most appropriate for this patient at this time. Physical exam was limited by quality of the video and audio technology used for the visit.   Patient location: home Provider location: [facility]  I discussed the limitations of evaluation and management by telemedicine and the availability of in person appointments. The patient expressed understanding and agreed to proceed.  Patient: Tamara Weeks   DOB: January 24, 1966   55 y.o. Female  MRN: [id number] Visit Date: 11/10/2021  Today's healthcare provider: Lelon Huh, MD   Chief Complaint  Patient presents with   Leg Pain    Subjective    Leg Pain  Incident onset: 1 month ago. There was no injury mechanism. Pain location: right inguinal/ groin area that radiates down to right knee. The quality of the pain is described as shooting. The pain has been Intermittent since onset. Associated symptoms include muscle weakness (right leg weakness). Pertinent negatives include no inability to bear weight, loss of motion, loss of sensation, numbness or tingling. Exacerbated by: standing after prolonged sitting. Treatments tried: walking.   She has similar sx in the past and responded well to prednisone and lidocaine patches.    Medications: Outpatient Medications Prior to Visit  Medication Sig   Calcium Carb-Cholecalciferol (CALCIUM/VITAMIN D PO) Take by mouth daily.   citalopram (CELEXA) 40 MG tablet TAKE 1 TABLET(40 MG) BY MOUTH DAILY   clobetasol (OLUX) 0.05 % topical foam APPLY EXTERNALLY TO THE AFFECTED AREA TWICE DAILY   clonazePAM (KLONOPIN) 0.5 MG  tablet Take 1 tablet (0.5 mg total) by mouth 2 (two) times daily as needed for anxiety.   diclofenac Sodium (VOLTAREN) 1 % GEL Apply 2 g topically 4 (four) times daily.   fluocinonide (LIDEX) 0.05 % external solution Apply topically.   hydrocortisone (ANUSOL-HC) 2.5 % rectal cream Place 1 application rectally 2 (two) times daily.   lidocaine (LIDODERM) 5 % Place 1 patch onto the skin daily. Remove & Discard patch within 12 hours or as directed by MD   Misc Natural Products (OSTEO BI-FLEX ADV TRIPLE ST PO) Take by mouth.   Multiple Vitamin (MULTIVITAMIN) tablet Take 1 tablet by mouth daily.   olopatadine (PATANOL) 0.1 % ophthalmic solution SMARTSIG:In Eye(s)   No facility-administered medications prior to visit.    Review of Systems  Constitutional:  Negative for appetite change, chills, fatigue and fever.  Respiratory:  Negative for chest tightness and shortness of breath.   Cardiovascular:  Negative for chest pain and palpitations.  Gastrointestinal:  Negative for abdominal pain, nausea and vomiting.  Musculoskeletal:  Positive for myalgias (right leg pain).  Neurological:  Positive for weakness (right leg). Negative for dizziness, tingling and numbness.     Objective    LMP 03/02/2016    Physical Exam   Awake, alert, oriented x 3. In no apparent distress   Assessment & Plan     1. Right hip pain Differential diagnosis includes recurrent groin strain versus hip arthropathy.   - lidocaine (LIDODERM) 5 %; Place 1 patch onto the skin daily. Remove & Discard patch within 12 hours or as directed by MD  Dispense: 30 patch;  Refill: 0 - predniSONE (DELTASONE) 10 MG tablet; Take 6 tablets on day 1, take 5 tablets on day 2 and so on until complete.  Dispense: 21 tablet; Refill: 0 - DG Hip Unilat W OR W/O Pelvis 2-3 Views Right; Future  2. Depression with anxiety Doing well on current dose of citalopram. refill - citalopram (CELEXA) 40 MG tablet; TAKE 1 TABLET(40 MG) BY MOUTH DAILY   Dispense: 90 tablet; Refill: 1  3. Adenomatous polyps Due for follow up colonoscopy with Dr. Vicente Males.  - Ambulatory referral to Gastroenterology     I discussed the assessment and treatment plan with the patient. The patient was provided an opportunity to ask questions and all were answered. The patient agreed with the plan and demonstrated an understanding of the instructions.   The patient was advised to call back or seek an in-person evaluation if the symptoms worsen or if the condition fails to improve as anticipated.  I provided 12 minutes of non-face-to-face time during this encounter.  The entirety of the information documented in the History of Present Illness, Review of Systems and Physical Exam were personally obtained by me. Portions of this information were initially documented by the CMA and reviewed by me for thoroughness and accuracy.    Lelon Huh, MD [facility] [phone] (phone) [phone] (fax)  [place]

## 2021-11-18 ENCOUNTER — Telehealth: Payer: Self-pay

## 2021-11-18 ENCOUNTER — Other Ambulatory Visit: Payer: Self-pay

## 2021-11-18 DIAGNOSIS — Z86010 Personal history of colonic polyps: Secondary | ICD-10-CM

## 2021-11-18 MED ORDER — SUTAB 1479-225-188 MG PO TABS: 12.0000 | ORAL_TABLET | Freq: Once | ORAL | 0 refills | Status: AC

## 2021-11-18 NOTE — Telephone Encounter (Signed)
Scheduled patient for procedure

## 2021-11-18 NOTE — Progress Notes (Signed)
Gastroenterology Pre-Procedure Review  Request Date: 12/29/2021 Requesting Physician: Dr. Vicente Males  PATIENT REVIEW QUESTIONS: The patient responded to the following health history questions as indicated:    1. Are you having any GI issues? no 2. Do you have a personal history of Polyps? yes (last colonoscopy) 3. Do you have a family history of Colon Cancer or Polyps? no 4. Diabetes Mellitus? no 5. Joint replacements in the past 12 months?no 6. Major health problems in the past 3 months?no 7. Any artificial heart valves, MVP, or defibrillator?no    MEDICATIONS & ALLERGIES:    Patient reports the following regarding taking any anticoagulation/antiplatelet therapy:   Plavix, Coumadin, Eliquis, Xarelto, Lovenox, Pradaxa, Brilinta, or Effient? no Aspirin? no  Patient confirms/reports the following medications:  Current Outpatient Medications  Medication Sig Dispense Refill   Calcium Carb-Cholecalciferol (CALCIUM/VITAMIN D PO) Take by mouth daily.     citalopram (CELEXA) 40 MG tablet TAKE 1 TABLET(40 MG) BY MOUTH DAILY 90 tablet 1   clobetasol (OLUX) 0.05 % topical foam APPLY EXTERNALLY TO THE AFFECTED AREA TWICE DAILY 50 g 0   clonazePAM (KLONOPIN) 0.5 MG tablet Take 1 tablet (0.5 mg total) by mouth 2 (two) times daily as needed for anxiety. 20 tablet 0   diclofenac Sodium (VOLTAREN) 1 % GEL Apply 2 g topically 4 (four) times daily. 2 g 1   fluocinonide (LIDEX) 0.05 % external solution Apply topically.     hydrocortisone (ANUSOL-HC) 2.5 % rectal cream Place 1 application rectally 2 (two) times daily. 30 g 0   lidocaine (LIDODERM) 5 % Place 1 patch onto the skin daily. Remove & Discard patch within 12 hours or as directed by MD 30 patch 0   Misc Natural Products (OSTEO BI-FLEX ADV TRIPLE ST PO) Take by mouth.     Multiple Vitamin (MULTIVITAMIN) tablet Take 1 tablet by mouth daily.     olopatadine (PATANOL) 0.1 % ophthalmic solution SMARTSIG:In Eye(s)     predniSONE (DELTASONE) 10 MG tablet  Take 6 tablets on day 1, take 5 tablets on day 2 and so on until complete. 21 tablet 0   No current facility-administered medications for this visit.    Patient confirms/reports the following allergies:  Allergies  Allergen Reactions   Penicillins Other (See Comments)    No orders of the defined types were placed in this encounter.   AUTHORIZATION INFORMATION Primary Insurance: 1D#: Group #:  Secondary Insurance: 1D#: Group #:  SCHEDULE INFORMATION: Date: 12/29/2021 Time: Location: [facility]

## 2021-12-16 ENCOUNTER — Other Ambulatory Visit: Payer: Self-pay

## 2021-12-16 ENCOUNTER — Ambulatory Visit
Admission: RE | Admit: 2021-12-16 | Discharge: 2021-12-16 | Disposition: A | Discharge status: Home / Self Care | Payer: Managed Care, Other (non HMO) | Source: Ambulatory Visit | Attending: Family Medicine | Admitting: Family Medicine

## 2021-12-16 ENCOUNTER — Ambulatory Visit
Admission: RE | Admit: 2021-12-16 | Discharge: 2021-12-16 | Disposition: A | Discharge status: Home / Self Care | Payer: Managed Care, Other (non HMO) | Attending: Family Medicine | Admitting: Family Medicine

## 2021-12-16 DIAGNOSIS — M25551 Pain in right hip: Secondary | ICD-10-CM | POA: Insufficient documentation

## 2021-12-17 ENCOUNTER — Telehealth (INDEPENDENT_AMBULATORY_CARE_PROVIDER_SITE_OTHER): Payer: Managed Care, Other (non HMO) | Admitting: Family Medicine

## 2021-12-17 DIAGNOSIS — J0141 Acute recurrent pansinusitis: Secondary | ICD-10-CM | POA: Diagnosis not present

## 2021-12-17 DIAGNOSIS — J020 Streptococcal pharyngitis: Secondary | ICD-10-CM | POA: Diagnosis not present

## 2021-12-17 DIAGNOSIS — R599 Enlarged lymph nodes, unspecified: Secondary | ICD-10-CM

## 2021-12-17 MED ORDER — DOXYCYCLINE HYCLATE 100 MG PO TABS: 100.0000 mg | ORAL_TABLET | Freq: Two times a day (BID) | ORAL | 0 refills | Status: DC

## 2021-12-17 MED ORDER — AZITHROMYCIN 250 MG PO TABS: ORAL_TABLET | ORAL | 0 refills | Status: AC

## 2021-12-17 MED ORDER — PREDNISONE 20 MG PO TABS: 20.0000 mg | ORAL_TABLET | Freq: Every day | ORAL | 0 refills | Status: DC

## 2021-12-17 NOTE — Assessment & Plan Note (Signed)
Recurrent sinus issues for 2+ weeks Provide Abx Recommend OTC medications -delsym -mucinex -zyrtec/flonase

## 2021-12-17 NOTE — Progress Notes (Signed)
MyChart Video Visit    Virtual Visit via Video Note   This visit type was conducted due to national recommendations for restrictions regarding the COVID-19 Pandemic (e.g. social distancing) in an effort to limit this patient's exposure and mitigate transmission in our community. This patient is at least at moderate risk for complications without adequate follow up. This format is felt to be most appropriate for this patient at this time. Physical exam was limited by quality of the video and audio technology used for the visit.   Patient location: Home Provider location: [facility]  I discussed the limitations of evaluation and management by telemedicine and the availability of in person appointments. The patient expressed understanding and agreed to proceed.  Patient: Tamara Weeks   DOB: [birth year]   55 y.o. Female  MRN: [id number] Visit Date: 12/17/2021  Today's healthcare provider: Gwyneth Sprout, FNP   Chief Complaint  Patient presents with   URI   I,Sulibeya S Dimas,acting as a scribe for Gwyneth Sprout, FNP.,have documented all relevant documentation on the behalf of Gwyneth Sprout, FNP,as directed by  Gwyneth Sprout, FNP while in the presence of Gwyneth Sprout, FNP.  Subjective    URI  This is a new problem. The current episode started 1 to 4 weeks ago. The problem has been unchanged. There has been no fever. Associated symptoms include congestion, coughing, rhinorrhea and sinus pain. Pertinent negatives include no ear pain, headaches, nausea, sore throat, vomiting or wheezing. Treatments tried: dayquil and nyquil. The treatment provided moderate relief.    Medications: Outpatient Medications Prior to Visit  Medication Sig   Calcium Carb-Cholecalciferol (CALCIUM/VITAMIN D PO) Take by mouth daily.   citalopram (CELEXA) 40 MG tablet TAKE 1 TABLET(40 MG) BY MOUTH DAILY   clobetasol (OLUX) 0.05 % topical foam APPLY EXTERNALLY TO THE AFFECTED AREA TWICE DAILY   clonazePAM  (KLONOPIN) 0.5 MG tablet Take 1 tablet (0.5 mg total) by mouth 2 (two) times daily as needed for anxiety.   diclofenac Sodium (VOLTAREN) 1 % GEL Apply 2 g topically 4 (four) times daily.   fluocinonide (LIDEX) 0.05 % external solution Apply topically.   hydrocortisone (ANUSOL-HC) 2.5 % rectal cream Place 1 application rectally 2 (two) times daily.   lidocaine (LIDODERM) 5 % Place 1 patch onto the skin daily. Remove & Discard patch within 12 hours or as directed by MD   Misc Natural Products (OSTEO BI-FLEX ADV TRIPLE ST PO) Take by mouth.   Multiple Vitamin (MULTIVITAMIN) tablet Take 1 tablet by mouth daily.   olopatadine (PATANOL) 0.1 % ophthalmic solution SMARTSIG:In Eye(s)   [DISCONTINUED] predniSONE (DELTASONE) 10 MG tablet Take 6 tablets on day 1, take 5 tablets on day 2 and so on until complete. (Patient not taking: Reported on 12/17/2021)   No facility-administered medications prior to visit.    Review of Systems  HENT:  Positive for congestion, rhinorrhea, sinus pain and voice change. Negative for ear pain and sore throat.   Respiratory:  Positive for cough and shortness of breath. Negative for chest tightness and wheezing.   Gastrointestinal:  Negative for nausea and vomiting.  Neurological:  Negative for headaches.      Objective    LMP 03/02/2016     Physical Exam     Assessment & Plan     Problem List Items Addressed This Visit       Respiratory   Pharyngitis due to Streptococcus species - Primary    No culture at  this time Continue to treat with assumed infection RTC if symptoms do not resolve      Relevant Medications   azithromycin (ZITHROMAX) 250 MG tablet   doxycycline (VIBRA-TABS) 100 MG tablet   Acute recurrent pansinusitis    Recurrent sinus issues for 2+ weeks Provide Abx Recommend OTC medications -delsym -mucinex -zyrtec/flonase      Relevant Medications   azithromycin (ZITHROMAX) 250 MG tablet   doxycycline (VIBRA-TABS) 100 MG tablet    predniSONE (DELTASONE) 20 MG tablet     Immune and Lymphatic   Swollen gland    Prednisone to assist with inflammation      Relevant Medications   predniSONE (DELTASONE) 20 MG tablet    No follow-ups on file.     I discussed the assessment and treatment plan with the patient. The patient was provided an opportunity to ask questions and all were answered. The patient agreed with the plan and demonstrated an understanding of the instructions.   The patient was advised to call back or seek an in-person evaluation if the symptoms worsen or if the condition fails to improve as anticipated.  I provided 5 minutes of non-face-to-face time during this encounter.  Vonna Kotyk, FNP, have reviewed all documentation for this visit. The documentation on 12/17/21 for the exam, diagnosis, procedures, and orders are all accurate and complete.   Gwyneth Sprout, [place] [phone] (phone) [phone] (fax)  [place]

## 2021-12-17 NOTE — Assessment & Plan Note (Signed)
Prednisone to assist with inflammation

## 2021-12-17 NOTE — Assessment & Plan Note (Signed)
No culture at this time Continue to treat with assumed infection RTC if symptoms do not resolve

## 2021-12-25 ENCOUNTER — Other Ambulatory Visit: Payer: Self-pay

## 2021-12-25 MED ORDER — CLENPIQ 10-3.5-12 MG-GM -GM/160ML PO SOLN: ORAL | 0 refills | Status: DC

## 2021-12-29 ENCOUNTER — Encounter: Payer: Self-pay | Admitting: Gastroenterology

## 2021-12-29 ENCOUNTER — Ambulatory Visit: Payer: Managed Care, Other (non HMO) | Admitting: Certified Registered"

## 2021-12-29 ENCOUNTER — Encounter
Admission: RE | Disposition: A | Discharge status: Home / Self Care | Payer: Self-pay | Source: Home / Self Care | Attending: Gastroenterology

## 2021-12-29 ENCOUNTER — Other Ambulatory Visit: Payer: Self-pay

## 2021-12-29 ENCOUNTER — Ambulatory Visit
Admission: RE | Admit: 2021-12-29 | Discharge: 2021-12-29 | Disposition: A | Discharge status: Home / Self Care | Payer: Managed Care, Other (non HMO) | Attending: Gastroenterology | Admitting: Gastroenterology

## 2021-12-29 DIAGNOSIS — Z86010 Personal history of colonic polyps: Secondary | ICD-10-CM | POA: Diagnosis not present

## 2021-12-29 DIAGNOSIS — E785 Hyperlipidemia, unspecified: Secondary | ICD-10-CM | POA: Insufficient documentation

## 2021-12-29 DIAGNOSIS — R011 Cardiac murmur, unspecified: Secondary | ICD-10-CM | POA: Insufficient documentation

## 2021-12-29 DIAGNOSIS — E119 Type 2 diabetes mellitus without complications: Secondary | ICD-10-CM | POA: Diagnosis not present

## 2021-12-29 DIAGNOSIS — F418 Other specified anxiety disorders: Secondary | ICD-10-CM | POA: Insufficient documentation

## 2021-12-29 DIAGNOSIS — Z1211 Encounter for screening for malignant neoplasm of colon: Secondary | ICD-10-CM | POA: Insufficient documentation

## 2021-12-29 DIAGNOSIS — G43909 Migraine, unspecified, not intractable, without status migrainosus: Secondary | ICD-10-CM | POA: Insufficient documentation

## 2021-12-29 HISTORY — PX: COLONOSCOPY WITH PROPOFOL: SHX5780

## 2021-12-29 SURGERY — COLONOSCOPY WITH PROPOFOL
Anesthesia: General

## 2021-12-29 MED ORDER — SODIUM CHLORIDE 0.9 % IV SOLN: INTRAVENOUS | Status: DC

## 2021-12-29 MED ORDER — PROPOFOL 10 MG/ML IV BOLUS
INTRAVENOUS | Status: DC | PRN
  Administered 2021-12-29: 160 mg via INTRAVENOUS

## 2021-12-29 MED ORDER — DEXMEDETOMIDINE HCL IN NACL 200 MCG/50ML IV SOLN
INTRAVENOUS | Status: DC | PRN
  Administered 2021-12-29: 10 ug via INTRAVENOUS

## 2021-12-29 MED ORDER — PROPOFOL 500 MG/50ML IV EMUL
INTRAVENOUS | Status: AC
  Filled 2021-12-29: qty 50

## 2021-12-29 MED ORDER — PROPOFOL 500 MG/50ML IV EMUL
INTRAVENOUS | Status: DC | PRN
  Administered 2021-12-29: 125 ug/kg/min via INTRAVENOUS

## 2021-12-29 NOTE — Anesthesia Postprocedure Evaluation (Signed)
Anesthesia Post Note  Patient: Tamara Weeks  Procedure(s) Performed: COLONOSCOPY WITH PROPOFOL  Patient location during evaluation: Endoscopy Anesthesia Type: General Level of consciousness: awake and alert Pain management: pain level controlled Vital Signs Assessment: post-procedure vital signs reviewed and stable Respiratory status: spontaneous breathing, nonlabored ventilation, respiratory function stable and patient connected to nasal cannula oxygen Cardiovascular status: blood pressure returned to baseline and stable Postop Assessment: no apparent nausea or vomiting Anesthetic complications: no   No notable events documented.   Last Vitals:  Vitals:   12/29/21 0850 12/29/21 0900  BP: 109/68   Pulse: 72 74  Resp: 13 15  Temp:    SpO2: 99% 100%    Last Pain:  Vitals:   12/29/21 0807  TempSrc: Temporal  PainSc: 0-No pain                 Arita Miss

## 2021-12-29 NOTE — Transfer of Care (Signed)
Immediate Anesthesia Transfer of Care Note  Patient: Tamara Weeks  Procedure(s) Performed: COLONOSCOPY WITH PROPOFOL  Patient Location: PACU  Anesthesia Type:General  Level of Consciousness: awake  Airway & Oxygen Therapy: Patient Spontanous Breathing  Post-op Assessment: Report given to RN  Post vital signs: stable  Last Vitals:  Vitals Value Taken Time  BP    Temp    Pulse    Resp    SpO2      Last Pain:  Vitals:   12/29/21 0807  TempSrc: Temporal  PainSc: 0-No pain         Complications: No notable events documented.

## 2021-12-29 NOTE — Anesthesia Preprocedure Evaluation (Signed)
Anesthesia Evaluation  Patient identified by MRN, date of birth, ID band Patient awake    Reviewed: Allergy & Precautions, NPO status , Patient's Chart, lab work & pertinent test results  History of Anesthesia Complications Negative for: history of anesthetic complications  Airway Mallampati: II  TM Distance: >3 FB Neck ROM: Full    Dental no notable dental hx. (+) Teeth Intact   Pulmonary neg pulmonary ROS, neg sleep apnea, neg COPD, Patient abstained from smoking.Not current smoker,    Pulmonary exam normal breath sounds clear to auscultation       Cardiovascular Exercise Tolerance: Good METS(-) hypertension(-) CAD and (-) Past MI negative cardio ROS  (-) dysrhythmias  Rhythm:Regular Rate:Normal - Systolic murmurs    Neuro/Psych  Headaches, PSYCHIATRIC DISORDERS Anxiety Depression    GI/Hepatic neg GERD  ,(+)     (-) substance abuse  ,   Endo/Other  diabetes  Renal/GU negative Renal ROS     Musculoskeletal   Abdominal   Peds  Hematology   Anesthesia Other Findings Past Medical History: No date: Adenomatous polyps No date: Allergy No date: Anxiety No date: Chicken pox No date: Depression No date: Gestational diabetes No date: Hyperlipidemia No date: Migraines No date: Mononucleosis  Reproductive/Obstetrics                             Anesthesia Physical Anesthesia Plan  ASA: 2  Anesthesia Plan: General   Post-op Pain Management: Minimal or no pain anticipated   Induction: Intravenous  PONV Risk Score and Plan: 3 and Propofol infusion, TIVA and Ondansetron  Airway Management Planned: Nasal Cannula  Additional Equipment: None  Intra-op Plan:   Post-operative Plan:   Informed Consent: I have reviewed the patients History and Physical, chart, labs and discussed the procedure including the risks, benefits and alternatives for the proposed anesthesia with the patient or  authorized representative who has indicated his/her understanding and acceptance.     Dental advisory given  Plan Discussed with: CRNA and Surgeon  Anesthesia Plan Comments: (Discussed risks of anesthesia with patient, including possibility of difficulty with spontaneous ventilation under anesthesia necessitating airway intervention, PONV, and rare risks such as cardiac or respiratory or neurological events, and allergic reactions. Discussed the role of CRNA in patient's perioperative care. Patient understands.)        Anesthesia Quick Evaluation

## 2021-12-29 NOTE — Op Note (Signed)
[facility] Gastroenterology Patient Name: Tamara Weeks Procedure Date: 12/29/2021 8:16 AM MRN: [id number] Account #: [id number] Date of Birth: Nov 24, 1966 Admit Type: Outpatient Age: 56 Room: [facility] ENDO ROOM 2 Gender: Female Note Status: Finalized Instrument Name: Park Meo [id number] Procedure:             Colonoscopy Indications:           Surveillance: Personal history of adenomatous polyps                         on last colonoscopy > 3 years ago, Last colonoscopy:                         August 2019 Providers:             Jonathon Bellows MD, MD Medicines:             Monitored Anesthesia Care Complications:         No immediate complications. Procedure:             Pre-Anesthesia Assessment:                        - Prior to the procedure, a History and Physical was                         performed, and patient medications, allergies and                         sensitivities were reviewed. The patient's tolerance                         of previous anesthesia was reviewed.                        - The risks and benefits of the procedure and the                         sedation options and risks were discussed with the                         patient. All questions were answered and informed                         consent was obtained.                        - ASA Grade Assessment: II - A patient with mild                         systemic disease.                        After obtaining informed consent, the colonoscope was                         passed under direct vision. Throughout the procedure,                         the patient's blood pressure, pulse, and oxygen  saturations were monitored continuously. The                         Colonoscope was introduced through the anus and                         advanced to the the cecum, identified by the                         appendiceal orifice. The colonoscopy was performed                          with ease. The patient tolerated the procedure well.                         The quality of the bowel preparation was poor. Findings:      The perianal and digital rectal examinations were normal.      A moderate amount of semi-liquid stool was found in the entire colon,       interfering with visualization. Impression:            - Preparation of the colon was poor.                        - Stool in the entire examined colon.                        - No specimens collected. Recommendation:        - Discharge patient to home (with escort).                        - Resume previous diet.                        - Continue present medications.                        - Repeat colonoscopy in 1 month because the bowel                         preparation was suboptimal. Procedure Code(s):     --- Professional ---                        [phone], Colonoscopy, flexible; diagnostic, including                         collection of specimen(s) by brushing or washing, when                         performed (separate procedure) Diagnosis Code(s):     --- Professional ---                        Z86.010, Personal history of colonic polyps CPT copyright 2019 American Medical Association. All rights reserved. The codes documented in this report are preliminary and upon coder review may  be revised to meet current compliance requirements. Jonathon Bellows, MD Jonathon Bellows MD, MD 12/29/2021 8:42:03 AM This report has been signed electronically. Number of Addenda: 0 Note Initiated On: 12/29/2021 8:16 AM Scope Withdrawal Time:  0 hours 5 minutes 25 seconds  Total Procedure Duration: 0 hours 8 minutes 13 seconds  Estimated Blood Loss:  Estimated blood loss: none.      [facility]

## 2021-12-29 NOTE — H&P (Signed)
Jonathon Bellows, MD [address] [place], [place], [place], [address] [address], [place], [place], [place], [address] Phone: [phone]  Fax: [phone]  Primary Care Physician:  [place]   Pre-Procedure History & Physical: HPI:  Tamara Weeks is a 56 y.o. female is here for an colonoscopy.   Past Medical History:  Diagnosis Date   Adenomatous polyps    Allergy    Anxiety    Chicken pox    Depression    Gestational diabetes    Hyperlipidemia    Migraines    Mononucleosis     Past Surgical History:  Procedure Laterality Date   CARPAL TUNNEL RELEASE     CESAREAN SECTION  1997   COLONOSCOPY WITH PROPOFOL N/A 08/04/2018   Procedure: COLONOSCOPY WITH PROPOFOL;  Surgeon: Jonathon Bellows, MD;  Location: [facility] ENDOSCOPY;  Service: Gastroenterology;  Laterality: N/A;   TONSILLECTOMY AND ADENOIDECTOMY  1975    Prior to Admission medications   Medication Sig Start Date End Date Taking? Authorizing Provider  Calcium Carb-Cholecalciferol (CALCIUM/VITAMIN D PO) Take by mouth daily.   Yes [provider]  citalopram (CELEXA) 40 MG tablet TAKE 1 TABLET(40 MG) BY MOUTH DAILY 11/10/21  Yes Birdie Sons, MD  clonazePAM (KLONOPIN) 0.5 MG tablet Take 1 tablet (0.5 mg total) by mouth 2 (two) times daily as needed for anxiety. 04/23/21  Yes Rod Can, CNM  lidocaine (LIDODERM) 5 % Place 1 patch onto the skin daily. Remove & Discard patch within 12 hours or as directed by MD 11/10/21  Yes Birdie Sons, MD  Misc Natural Products (OSTEO BI-FLEX ADV TRIPLE ST PO) Take by mouth.   Yes [provider]  Multiple Vitamin (MULTIVITAMIN) tablet Take 1 tablet by mouth daily.   Yes [provider]  olopatadine (PATANOL) 0.1 % ophthalmic solution SMARTSIG:In Eye(s) 04/19/21  Yes [provider]  Sod Picosulfate-Mag Ox-Cit Acd (CLENPIQ) 10-3.5-12 MG-GM -GM/160ML SOLN Take 1 bottle at 5 PM followed by five 8 oz cups of water and repeat 5  hours before procedure. 12/25/21  Yes Jonathon Bellows, MD  clobetasol (OLUX) 0.05 % topical foam APPLY EXTERNALLY TO THE AFFECTED AREA TWICE DAILY 05/16/20   Trinna Post, PA-C  diclofenac Sodium (VOLTAREN) 1 % GEL Apply 2 g topically 4 (four) times daily. 06/15/21   Vigg, Avanti, MD  doxycycline (VIBRA-TABS) 100 MG tablet Take 1 tablet (100 mg total) by mouth 2 (two) times daily. Patient not taking: Reported on 12/29/2021 12/17/21   Gwyneth Sprout, FNP  fluocinonide (LIDEX) 0.05 % external solution Apply topically. 04/09/21   [provider]  hydrocortisone (ANUSOL-HC) 2.5 % rectal cream Place 1 application rectally 2 (two) times daily. 11/05/20   Trinna Post, PA-C  predniSONE (DELTASONE) 20 MG tablet Take 1 tablet (20 mg total) by mouth daily with breakfast. Patient not taking: Reported on 12/29/2021 12/17/21   Gwyneth Sprout, FNP    Allergies as of 11/18/2021 - Review Complete 11/18/2021  Allergen Reaction Noted   Penicillins Other (See Comments) 12/29/2015    Family History  Problem Relation Age of Onset   Arthritis Mother    Hypertension Mother    Depression Mother    Arthritis Maternal Grandmother    Hyperlipidemia Maternal Grandmother    Diabetes Maternal Grandmother    Hypertension Maternal Grandmother    Stroke Maternal Grandfather    Hyperlipidemia Maternal Grandfather    Alzheimer's disease Maternal Grandfather    Hyperlipidemia Paternal Grandmother  Diabetes Paternal Grandmother    Hypertension Paternal Grandmother    Hyperlipidemia Paternal Grandfather    Diabetes Father 58   Hyperlipidemia Father    Dementia Father    Diabetes Maternal Aunt    Heart disease Maternal Aunt    Hypertension Maternal Aunt     Social History   Socioeconomic History   Marital status: Married    Spouse name: Not on file   Number of children: 1   Years of education: 16   Highest education level: Not on file  Occupational History   Occupation: [employer]  Tobacco Use    Smoking status: Never   Smokeless tobacco: Never  Vaping Use   Vaping Use: Never used  Substance and Sexual Activity   Alcohol use: Not Currently   Drug use: No   Sexual activity: Not Currently    Partners: Male  Other Topics Concern   Not on file  Social History Narrative   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 Neck:  Supple; no masses or thyromegaly. Lungs:  Clear throughout to auscultation, normal respiratory effort.    Heart:  +S1, +S2, Regular rate and rhythm, No edema. Abdomen:  Soft, nontender and nondistended. Normal bowel sounds, without guarding, and without rebound.   Neurologic:  Alert and  oriented x4;  grossly normal neurologically.  Impression/Plan: [name] is here for an colonoscopy to be performed for surveillance due to prior history of colon polyps   Risks, benefits, limitations, and alternatives regarding  colonoscopy have been reviewed with the patient.  Questions have been answered.  All parties agreeable.   Jonathon Bellows, MD  12/29/2021, 8:11 AM

## 2021-12-30 ENCOUNTER — Telehealth: Payer: Self-pay

## 2021-12-30 NOTE — Telephone Encounter (Signed)
CALLED PATIENT NO ANSWER LEFT VOICEMAIL FOR A CALL BACK ? ?

## 2022-04-14 ENCOUNTER — Telehealth: Payer: Self-pay | Admitting: Family Medicine

## 2022-04-14 NOTE — Telephone Encounter (Signed)
Left message for patient to call office to make annual for May ?

## 2022-05-10 ENCOUNTER — Other Ambulatory Visit: Payer: Self-pay | Admitting: Family Medicine

## 2022-05-10 DIAGNOSIS — F418 Other specified anxiety disorders: Secondary | ICD-10-CM

## 2022-05-11 NOTE — Telephone Encounter (Signed)
Requested Prescriptions  Pending Prescriptions Disp Refills  . citalopram (CELEXA) 40 MG tablet [Pharmacy Med Name: CITALOPRAM '40MG'$  TABLETS] 90 tablet 0    Sig: TAKE 1 TABLET(40 MG) BY MOUTH DAILY     Psychiatry:  Antidepressants - SSRI Passed - 05/10/2022  3:30 AM      Passed - Completed PHQ-2 or PHQ-9 in the last 360 days      Passed - Valid encounter within last 6 months    Recent Outpatient Visits          4 months ago Pharyngitis due to Streptococcus species   [facility] Tally Joe T, FNP   6 months ago Right hip pain   [facility] Birdie Sons, MD   11 months ago Muscle ache of extremity   [facility] Vigg, Avanti, MD   1 year ago Annual physical exam   [facility] Trinna Post, [place]   2 years ago [place], [place], [place]

## 2022-08-15 ENCOUNTER — Other Ambulatory Visit: Payer: Self-pay | Admitting: Family Medicine

## 2022-08-15 DIAGNOSIS — F418 Other specified anxiety disorders: Secondary | ICD-10-CM

## 2022-11-01 ENCOUNTER — Other Ambulatory Visit: Payer: Self-pay | Admitting: *Deleted

## 2022-11-01 ENCOUNTER — Telehealth: Payer: Self-pay | Admitting: *Deleted

## 2022-11-01 DIAGNOSIS — Z86010 Personal history of colonic polyps: Secondary | ICD-10-CM

## 2022-11-01 MED ORDER — CLENPIQ 10-3.5-12 MG-GM -GM/160ML PO SOLN: 1.0000 | Freq: Once | ORAL | 0 refills | Status: AC

## 2022-11-01 NOTE — Telephone Encounter (Signed)
Patient called office due to needing to schedule colonoscopy.  According the procedure notes on last colonoscopy on 12/29/2021: Repeat colonoscopy in 1 month because the bowel preparation was suboptimal.  Gastroenterology Pre-Procedure Review  Request Date: 11/16/2022 Requesting Physician: Dr. Vicente Males  PATIENT REVIEW QUESTIONS: The patient responded to the following health history questions as indicated:    1. Are you having any GI issues? no 2. Do you have a personal history of Polyps? no 3. Do you have a family history of Colon Cancer or Polyps? no 4. Diabetes Mellitus? no 5. Joint replacements in the past 12 months?no 6. Major health problems in the past 3 months?no 7. Any artificial heart valves, MVP, or defibrillator?no    MEDICATIONS & ALLERGIES:    Patient reports the following regarding taking any anticoagulation/antiplatelet therapy:   Plavix, Coumadin, Eliquis, Xarelto, Lovenox, Pradaxa, Brilinta, or Effient? no Aspirin? no  Patient confirms/reports the following medications:  Current Outpatient Medications  Medication Sig Dispense Refill   Calcium Carb-Cholecalciferol (CALCIUM/VITAMIN D PO) Take by mouth daily.     citalopram (CELEXA) 40 MG tablet TAKE 1 TABLET(40 MG) BY MOUTH DAILY. Please schedule office visit before any future refill. 30 tablet 0   clobetasol (OLUX) 0.05 % topical foam APPLY EXTERNALLY TO THE AFFECTED AREA TWICE DAILY 50 g 0   clonazePAM (KLONOPIN) 0.5 MG tablet Take 1 tablet (0.5 mg total) by mouth 2 (two) times daily as needed for anxiety. 20 tablet 0   diclofenac Sodium (VOLTAREN) 1 % GEL Apply 2 g topically 4 (four) times daily. 2 g 1   doxycycline (VIBRA-TABS) 100 MG tablet Take 1 tablet (100 mg total) by mouth 2 (two) times daily. (Patient not taking: Reported on 12/29/2021) 20 tablet 0   fluocinonide (LIDEX) 0.05 % external solution Apply topically.     hydrocortisone (ANUSOL-HC) 2.5 % rectal cream Place 1 application rectally 2 (two) times daily. 30  g 0   lidocaine (LIDODERM) 5 % Place 1 patch onto the skin daily. Remove & Discard patch within 12 hours or as directed by MD 30 patch 0   Misc Natural Products (OSTEO BI-FLEX ADV TRIPLE ST PO) Take by mouth.     Multiple Vitamin (MULTIVITAMIN) tablet Take 1 tablet by mouth daily.     olopatadine (PATANOL) 0.1 % ophthalmic solution SMARTSIG:In Eye(s)     predniSONE (DELTASONE) 20 MG tablet Take 1 tablet (20 mg total) by mouth daily with breakfast. (Patient not taking: Reported on 12/29/2021) 5 tablet 0   Sod Picosulfate-Mag Ox-Cit Acd (CLENPIQ) 10-3.5-12 MG-GM -GM/160ML SOLN Take 1 bottle at 5 PM followed by five 8 oz cups of water and repeat 5 hours before procedure. 320 mL 0   No current facility-administered medications for this visit.    Patient confirms/reports the following allergies:  Allergies  Allergen Reactions   Penicillins Hives    No orders of the defined types were placed in this encounter.   AUTHORIZATION INFORMATION Primary Insurance: 1D#: Group #:  Secondary Insurance: 1D#: Group #:  SCHEDULE INFORMATION: Date: 11/16/2022 Time: Location: [place]

## 2022-11-08 ENCOUNTER — Other Ambulatory Visit: Payer: Self-pay

## 2022-11-08 MED ORDER — PEG 3350-KCL-NA BICARB-NACL 420 G PO SOLR: ORAL | 0 refills | Status: DC

## 2022-11-09 ENCOUNTER — Telehealth: Payer: Self-pay | Admitting: *Deleted

## 2022-11-09 MED ORDER — NA SULFATE-K SULFATE-MG SULF 17.5-3.13-1.6 GM/177ML PO SOLN: 1.0000 | Freq: Once | ORAL | 0 refills | Status: AC

## 2022-11-09 NOTE — Telephone Encounter (Signed)
Patient called office stating that her husband went to pick up her prep solution, it was not what she wanted.  I have inform her that it was change by provider's assistant, it could be because the prep solution went sent on 11/01/2022 was covered by her insurance as expected. Patient in return stated that she is willing to pay out of pocket for the prescription. So I went ahead and sent Rx for Suprep because it will cost patient only $37.29, Clenpiq would have been $170.29. These are prices at [employer].  Sent the coupon through text for patient and I have noted coupon on the Rx that was sent to [facility]  Updated instructions for patient. Patient verbalized understanding.

## 2022-11-16 ENCOUNTER — Ambulatory Visit: Payer: Managed Care, Other (non HMO) | Admitting: Anesthesiology

## 2022-11-16 ENCOUNTER — Ambulatory Visit
Admission: RE | Admit: 2022-11-16 | Discharge: 2022-11-16 | Disposition: A | Discharge status: Home / Self Care | Payer: Managed Care, Other (non HMO) | Attending: Gastroenterology | Admitting: Gastroenterology

## 2022-11-16 ENCOUNTER — Encounter: Payer: Self-pay | Admitting: Gastroenterology

## 2022-11-16 ENCOUNTER — Encounter
Admission: RE | Disposition: A | Discharge status: Home / Self Care | Payer: Self-pay | Source: Home / Self Care | Attending: Gastroenterology

## 2022-11-16 DIAGNOSIS — K5730 Diverticulosis of large intestine without perforation or abscess without bleeding: Secondary | ICD-10-CM | POA: Diagnosis not present

## 2022-11-16 DIAGNOSIS — D126 Benign neoplasm of colon, unspecified: Secondary | ICD-10-CM | POA: Diagnosis not present

## 2022-11-16 DIAGNOSIS — D120 Benign neoplasm of cecum: Secondary | ICD-10-CM | POA: Insufficient documentation

## 2022-11-16 DIAGNOSIS — Z6833 Body mass index (BMI) 33.0-33.9, adult: Secondary | ICD-10-CM | POA: Insufficient documentation

## 2022-11-16 DIAGNOSIS — Z86010 Personal history of colonic polyps: Secondary | ICD-10-CM

## 2022-11-16 DIAGNOSIS — Z1211 Encounter for screening for malignant neoplasm of colon: Secondary | ICD-10-CM | POA: Diagnosis not present

## 2022-11-16 DIAGNOSIS — E669 Obesity, unspecified: Secondary | ICD-10-CM | POA: Diagnosis not present

## 2022-11-16 HISTORY — DX: Unspecified osteoarthritis, unspecified site: M19.90

## 2022-11-16 HISTORY — DX: Prediabetes: R73.03

## 2022-11-16 HISTORY — PX: COLONOSCOPY WITH PROPOFOL: SHX5780

## 2022-11-16 SURGERY — COLONOSCOPY WITH PROPOFOL
Anesthesia: General

## 2022-11-16 MED ORDER — PHENYLEPHRINE 80 MCG/ML (10ML) SYRINGE FOR IV PUSH (FOR BLOOD PRESSURE SUPPORT)
PREFILLED_SYRINGE | INTRAVENOUS | Status: DC | PRN
  Administered 2022-11-16 (×3): 80 ug via INTRAVENOUS

## 2022-11-16 MED ORDER — LIDOCAINE HCL (PF) 2 % IJ SOLN
INTRAMUSCULAR | Status: AC
  Filled 2022-11-16: qty 5

## 2022-11-16 MED ORDER — DEXMEDETOMIDINE HCL IN NACL 80 MCG/20ML IV SOLN
INTRAVENOUS | Status: AC
  Filled 2022-11-16: qty 20

## 2022-11-16 MED ORDER — SODIUM CHLORIDE 0.9 % IV SOLN: INTRAVENOUS | Status: DC

## 2022-11-16 MED ORDER — LIDOCAINE HCL (CARDIAC) PF 100 MG/5ML IV SOSY
PREFILLED_SYRINGE | INTRAVENOUS | Status: DC | PRN
  Administered 2022-11-16: 50 mg via INTRAVENOUS

## 2022-11-16 MED ORDER — PHENYLEPHRINE 80 MCG/ML (10ML) SYRINGE FOR IV PUSH (FOR BLOOD PRESSURE SUPPORT)
PREFILLED_SYRINGE | INTRAVENOUS | Status: AC
  Filled 2022-11-16: qty 10

## 2022-11-16 MED ORDER — PROPOFOL 500 MG/50ML IV EMUL
INTRAVENOUS | Status: DC | PRN
  Administered 2022-11-16: 170 ug/kg/min via INTRAVENOUS

## 2022-11-16 MED ORDER — PROPOFOL 10 MG/ML IV BOLUS
INTRAVENOUS | Status: DC | PRN
  Administered 2022-11-16: 60 mg via INTRAVENOUS
  Administered 2022-11-16 (×2): 20 mg via INTRAVENOUS

## 2022-11-16 MED ORDER — DEXMEDETOMIDINE HCL IN NACL 200 MCG/50ML IV SOLN
INTRAVENOUS | Status: DC | PRN
  Administered 2022-11-16: 8 ug via INTRAVENOUS

## 2022-11-16 NOTE — Anesthesia Procedure Notes (Signed)
Procedure Name: MAC Date/Time: 11/16/2022 8:17 AM  Performed by: Tollie Eth, CRNAPre-anesthesia Checklist: Patient identified, Emergency Drugs available, Suction available and Patient being monitored Patient Re-evaluated:Patient Re-evaluated prior to induction Oxygen Delivery Method: Nasal cannula Induction Type: IV induction Placement Confirmation: positive ETCO2

## 2022-11-16 NOTE — Anesthesia Postprocedure Evaluation (Signed)
Anesthesia Post Note  Patient: Tamara Weeks  Procedure(s) Performed: COLONOSCOPY WITH PROPOFOL  Patient location during evaluation: PACU Anesthesia Type: General Level of consciousness: awake and alert Pain management: pain level controlled Vital Signs Assessment: post-procedure vital signs reviewed and stable Respiratory status: spontaneous breathing, nonlabored ventilation and respiratory function stable Cardiovascular status: blood pressure returned to baseline and stable Postop Assessment: no apparent nausea or vomiting Anesthetic complications: no   No notable events documented.   Last Vitals:  Vitals:   11/16/22 0850 11/16/22 0902  BP: (!) 94/55 104/73  Pulse:    Resp:    Temp: 36.6 C   SpO2:      Last Pain:  Vitals:   11/16/22 0911  TempSrc:   PainSc: 0-No pain                 [place] Ouch

## 2022-11-16 NOTE — Op Note (Signed)
[facility] Patient Name: Tamara Weeks Procedure Date: 11/16/2022 8:18 AM MRN: [id number] Account #: [id number] Date of Birth: 06-08-1966 Admit Type: Outpatient Age: 55 Room: [facility] ENDO ROOM 2 Gender: Female Note Status: Finalized Instrument Name: Jasper Riling [id number] Procedure:             Colonoscopy Indications:           Screening for colorectal malignant neoplasm Providers:             Jonathon Bellows MD, MD Referring MD:          No Local Md, MD (Referring MD) Medicines:             Monitored Anesthesia Care Complications:         No immediate complications. Procedure:             Pre-Anesthesia Assessment:                        - Prior to the procedure, a History and Physical was                         performed, and patient medications, allergies and                         sensitivities were reviewed. The patient's tolerance                         of previous anesthesia was reviewed.                        - The risks and benefits of the procedure and the                         sedation options and risks were discussed with the                         patient. All questions were answered and informed                         consent was obtained.                        - ASA Grade Assessment: II - A patient with mild                         systemic disease.                        After obtaining informed consent, the colonoscope was                         passed under direct vision. Throughout the procedure,                         the patient's blood pressure, pulse, and oxygen                         saturations were monitored continuously. The                         Colonoscope was  introduced through the anus and                         advanced to the the cecum, identified by the                         appendiceal orifice. The colonoscopy was performed                         without difficulty. The patient tolerated the                          procedure well. The quality of the bowel preparation                         was excellent. The appendiceal orifice was                         photographed. Findings:      The perianal and digital rectal examinations were normal.      A 5 mm polyp was found in the cecum. The polyp was sessile. The polyp       was removed with a cold snare. Resection and retrieval were complete.      A few small-mouthed diverticula were found in the ascending colon.      The exam was otherwise without abnormality on direct and retroflexion       views. Impression:            - One 5 mm polyp in the cecum, removed with a cold                         snare. Resected and retrieved.                        - Diverticulosis in the ascending colon.                        - The examination was otherwise normal on direct and                         retroflexion views. Recommendation:        - Discharge patient to home (with escort).                        - Resume previous diet.                        - Continue present medications.                        - Await pathology results.                        - Repeat colonoscopy in 5 years for surveillance. Procedure Code(s):     --- Professional ---                        [phone], Colonoscopy, flexible; with removal of  tumor(s), polyp(s), or other lesion(s) by snare                         technique Diagnosis Code(s):     --- Professional ---                        Z12.11, Encounter for screening for malignant neoplasm                         of colon                        K57.30, Diverticulosis of large intestine without                         perforation or abscess without bleeding                        D12.0, Benign neoplasm of cecum CPT copyright 2022 American Medical Association. All rights reserved. The codes documented in this report are preliminary and upon coder review may  be revised to meet current compliance  requirements. Jonathon Bellows, MD Jonathon Bellows MD, MD 11/16/2022 8:50:48 AM This report has been signed electronically. Number of Addenda: 0 Note Initiated On: 11/16/2022 8:18 AM Scope Withdrawal Time: 0 hours 9 minutes 40 seconds  Total Procedure Duration: 0 hours 14 minutes 31 seconds  Estimated Blood Loss:  Estimated blood loss: none.      [facility]

## 2022-11-16 NOTE — Anesthesia Preprocedure Evaluation (Addendum)
Anesthesia Evaluation  Patient identified by MRN, date of birth, ID band Patient awake    Reviewed: Allergy & Precautions, NPO status , Patient's Chart, lab work & pertinent test results  History of Anesthesia Complications Negative for: history of anesthetic complications  Airway Mallampati: II  TM Distance: >3 FB Neck ROM: Full    Dental no notable dental hx. (+) Teeth Intact   Pulmonary neg pulmonary ROS, neg sleep apnea, neg COPD, Patient abstained from smoking.Not current smoker   Pulmonary exam normal breath sounds clear to auscultation       Cardiovascular Exercise Tolerance: Good METS(-) hypertension(-) CAD and (-) Past MI negative cardio ROS (-) dysrhythmias  Rhythm:Regular Rate:Normal - Systolic murmurs    Neuro/Psych  Headaches PSYCHIATRIC DISORDERS Anxiety Depression       GI/Hepatic ,neg GERD  ,,(+)     (-) substance abuse    Endo/Other  negative endocrine ROS    Renal/GU negative Renal ROS     Musculoskeletal   Abdominal  (+) + obese  Peds  Hematology   Anesthesia Other Findings Past Medical History: No date: Adenomatous polyps No date: Allergy No date: Anxiety No date: Chicken pox No date: Depression No date: Gestational diabetes No date: Hyperlipidemia No date: Migraines No date: Mononucleosis  Reproductive/Obstetrics                             Anesthesia Physical Anesthesia Plan  ASA: 2  Anesthesia Plan: General   Post-op Pain Management: Minimal or no pain anticipated   Induction: Intravenous  PONV Risk Score and Plan: 3 and Propofol infusion and TIVA  Airway Management Planned: Nasal Cannula  Additional Equipment: None  Intra-op Plan:   Post-operative Plan:   Informed Consent: I have reviewed the patients History and Physical, chart, labs and discussed the procedure including the risks, benefits and alternatives for the proposed anesthesia with  the patient or authorized representative who has indicated his/her understanding and acceptance.     Dental advisory given  Plan Discussed with: CRNA and Surgeon  Anesthesia Plan Comments:         Anesthesia Quick Evaluation

## 2022-11-16 NOTE — H&P (Signed)
Jonathon Bellows, MD [address], [place], [place], [place], [address] [address], [place], [place], [place], [address] Phone: [phone]  Fax: [phone]  Primary Care Physician:  Ellene Route   Pre-Procedure History & Physical: HPI:  Tamara Weeks is a 55 y.o. female is here for an colonoscopy.   Past Medical History:  Diagnosis Date   Adenomatous polyps    Allergy    Anxiety    Arthritis    Chicken pox    Depression    Hyperlipidemia    Migraines    Mononucleosis    Pre-diabetes     Past Surgical History:  Procedure Laterality Date   CARPAL TUNNEL RELEASE     CESAREAN SECTION  1997   COLONOSCOPY WITH PROPOFOL N/A 08/04/2018   Procedure: COLONOSCOPY WITH PROPOFOL;  Surgeon: Jonathon Bellows, MD;  Location: [facility] ENDOSCOPY;  Service: Gastroenterology;  Laterality: N/A;   COLONOSCOPY WITH PROPOFOL N/A 12/29/2021   Procedure: COLONOSCOPY WITH PROPOFOL;  Surgeon: Jonathon Bellows, MD;  Location: [facility] ENDOSCOPY;  Service: Gastroenterology;  Laterality: N/A;   TONSILLECTOMY AND ADENOIDECTOMY  1975    Prior to Admission medications   Medication Sig Start Date End Date Taking? Authorizing Provider  citalopram (CELEXA) 40 MG tablet TAKE 1 TABLET(40 MG) BY MOUTH DAILY. Please schedule office visit before any future refill. 08/16/22  Yes Gwyneth Sprout, FNP  clonazePAM (KLONOPIN) 0.5 MG tablet Take 1 tablet (0.5 mg total) by mouth 2 (two) times daily as needed for anxiety. 04/23/21  Yes Rod Can, CNM  diclofenac Sodium (VOLTAREN) 1 % GEL Apply 2 g topically 4 (four) times daily. 06/15/21  Yes Vigg, Avanti, MD  Calcium Carb-Cholecalciferol (CALCIUM/VITAMIN D PO) Take by mouth daily.    [provider]  clobetasol (OLUX) 0.05 % topical foam APPLY EXTERNALLY TO THE AFFECTED AREA TWICE DAILY 05/16/20   Trinna Post, PA-C  doxycycline (VIBRA-TABS) 100 MG tablet Take 1 tablet (100 mg total) by mouth 2 (two) times daily. Patient not taking: Reported on  12/29/2021 12/17/21   Gwyneth Sprout, FNP  fluocinonide (LIDEX) 0.05 % external solution Apply topically. 04/09/21   [provider]  hydrocortisone (ANUSOL-HC) 2.5 % rectal cream Place 1 application rectally 2 (two) times daily. 11/05/20   Trinna Post, PA-C  lidocaine (LIDODERM) 5 % Place 1 patch onto the skin daily. Remove & Discard patch within 12 hours or as directed by MD 11/10/21   Birdie Sons, MD  Misc Natural Products (OSTEO BI-FLEX ADV TRIPLE ST PO) Take by mouth.    [provider]  Multiple Vitamin (MULTIVITAMIN) tablet Take 1 tablet by mouth daily.    [provider]  olopatadine (PATANOL) 0.1 % ophthalmic solution SMARTSIG:In Eye(s) 04/19/21   [provider]  polyethylene glycol-electrolytes (NULYTELY) 420 g solution Prepare according to package instructions. Starting at 5:00 PM: Drink one 8 oz glass of mixture every 15 minutes until you finish half of the jug. Five hours prior to procedure, drink 8 oz glass of mixture every 15 minutes until it is all gone. Make sure you do not drink anything 4 hours prior to your procedure. 11/08/22   Jonathon Bellows, MD  predniSONE (DELTASONE) 20 MG tablet Take 1 tablet (20 mg total) by mouth daily with breakfast. Patient not taking: Reported on 12/29/2021 12/17/21   Gwyneth Sprout, FNP    Allergies as of 11/01/2022 - Review Complete 12/29/2021  Allergen Reaction Noted   Penicillins Hives 12/29/2015  Family History  Problem Relation Age of Onset   Arthritis Mother    Hypertension Mother    Depression Mother    Arthritis Maternal Grandmother    Hyperlipidemia Maternal Grandmother    Diabetes Maternal Grandmother    Hypertension Maternal Grandmother    Stroke Maternal Grandfather    Hyperlipidemia Maternal Grandfather    Alzheimer's disease Maternal Grandfather    Hyperlipidemia Paternal Grandmother    Diabetes Paternal Grandmother    Hypertension Paternal Grandmother    Hyperlipidemia Paternal  Grandfather    Diabetes Father 22   Hyperlipidemia Father    Dementia Father    Diabetes Maternal Aunt    Heart disease Maternal Aunt    Hypertension Maternal Aunt     Social History   Socioeconomic History   Marital status: Married    Spouse name: Not on file   Number of children: 1   Years of education: 16   Highest education level: Not on file  Occupational History   Occupation: [employer]  Tobacco Use   Smoking status: Never   Smokeless tobacco: Never  Vaping Use   Vaping Use: Never used  Substance and Sexual Activity   Alcohol use: Yes    Alcohol/week: 7.0 standard drinks of alcohol    Types: 7 Cans of beer per week    Comment: occassionally   Drug use: No   Sexual activity: Not Currently    Partners: Male  Other Topics Concern   Not on file  Social History Narrative   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 in [employer]   Social Determinants of Health   Financial Resource Strain: Not on file  Food Insecurity: Not on file  Transportation Needs: Not on file  Physical Activity: Not on file  Stress: Not on file  Social Connections: Not on file  Intimate Partner Violence: Not on file    Review of Systems: See HPI, otherwise negative ROS  Physical Exam: BP (!) 143/95   Pulse 99   Temp (!) 96.8 F (36 C) (Temporal)   Resp 18   Ht '5\' 5"'$  (1.651 m)   Wt 90.3 kg   LMP 03/02/2016   SpO2 99%   BMI 33.12 kg/m  General:   Alert,  pleasant and cooperative in NAD Head:  Normocephalic and atraumatic. Neck:  Supple; no masses or thyromegaly. Lungs:  Clear throughout to auscultation, normal respiratory effort.    Heart:  +S1, +S2, Regular rate and rhythm, No edema. Abdomen:  Soft, nontender and nondistended. Normal bowel sounds, without guarding, and without rebound.   Neurologic:  Alert and  oriented x4;  grossly normal neurologically.  Impression/Plan: Tamara Weeks is here for an colonoscopy to be performed for Screening  colonoscopy average risk   Risks, benefits, limitations, and alternatives regarding  colonoscopy have been reviewed with the patient.  Questions have been answered.  All parties agreeable.   Jonathon Bellows, MD  11/16/2022, 8:17 AM

## 2022-11-16 NOTE — Transfer of Care (Signed)
Immediate Anesthesia Transfer of Care Note  Patient: Tamara Weeks  Procedure(s) Performed: COLONOSCOPY WITH PROPOFOL  Patient Location: Endoscopy Unit  Anesthesia Type:General  Level of Consciousness: awake, alert , and oriented  Airway & Oxygen Therapy: Patient Spontanous Breathing  Post-op Assessment: Report given to RN and Post -op Vital signs reviewed and stable  Post vital signs: Reviewed and stable  Last Vitals:  Vitals Value Taken Time  BP 94/55 11/16/22 0853  Temp    Pulse 79 11/16/22 0854  Resp 17 11/16/22 0854  SpO2 100 % 11/16/22 0854  Vitals shown include unvalidated device data.  Last Pain:  Vitals:   11/16/22 0850  TempSrc:   PainSc: 0-No pain         Complications: No notable events documented.

## 2022-11-17 ENCOUNTER — Encounter: Payer: Self-pay | Admitting: Gastroenterology

## 2022-11-19 LAB — SURGICAL PATHOLOGY

## 2022-11-22 ENCOUNTER — Encounter: Payer: Self-pay | Admitting: Gastroenterology

## 2022-12-08 ENCOUNTER — Encounter: Payer: Self-pay | Admitting: Licensed Practical Nurse

## 2022-12-08 ENCOUNTER — Other Ambulatory Visit (HOSPITAL_COMMUNITY)
Admission: RE | Admit: 2022-12-08 | Discharge: 2022-12-08 | Disposition: A | Discharge status: Home / Self Care | Payer: Managed Care, Other (non HMO) | Source: Ambulatory Visit | Attending: Licensed Practical Nurse | Admitting: Licensed Practical Nurse

## 2022-12-08 ENCOUNTER — Ambulatory Visit (INDEPENDENT_AMBULATORY_CARE_PROVIDER_SITE_OTHER): Payer: Managed Care, Other (non HMO) | Admitting: Licensed Practical Nurse

## 2022-12-08 VITALS — BP 132/79 | HR 73 | Resp 16 | Ht 65.0 in | Wt 204.8 lb

## 2022-12-08 DIAGNOSIS — Z124 Encounter for screening for malignant neoplasm of cervix: Secondary | ICD-10-CM | POA: Diagnosis present

## 2022-12-08 DIAGNOSIS — Z01419 Encounter for gynecological examination (general) (routine) without abnormal findings: Secondary | ICD-10-CM | POA: Insufficient documentation

## 2022-12-08 NOTE — Progress Notes (Signed)
Gynecology Annual Exam  PCP: Ellene Route  Chief Complaint: No chief complaint on file.   History of Present Illness:Patient is a 56 y.o. G1P1001 presents for annual exam. The patient has no complaints today.   LMP: Patient's last menstrual period was 03/02/2016.  The patient is not sexually active. Her husband has a veneral disease so they have not had IC in years-Kim is fine with this.   The patient does perform self breast exams.  There is no notable family history of breast or ovarian cancer in her family.  The patient wears seatbelts: yes.   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    The patient reports current symptoms of depression.   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 Next colonoscopy due in 3 years    Colon cancer screening 06/07/2017   Palpitations 09/15/2016   Depression with anxiety 12/02/2015    Past Surgical History:  Past Surgical History:  Procedure Laterality Date   CARPAL TUNNEL RELEASE     CESAREAN SECTION  1997   COLONOSCOPY WITH PROPOFOL N/A 08/04/2018   Procedure: COLONOSCOPY WITH PROPOFOL;  Surgeon: Jonathon Bellows, MD;  Location: [facility] ENDOSCOPY;  Service: Gastroenterology;  Laterality: N/A;   COLONOSCOPY WITH PROPOFOL N/A  12/29/2021   Procedure: COLONOSCOPY WITH PROPOFOL;  Surgeon: Jonathon Bellows, MD;  Location: [facility] ENDOSCOPY;  Service: Gastroenterology;  Laterality: N/A;   COLONOSCOPY WITH PROPOFOL N/A 11/16/2022   Procedure: COLONOSCOPY WITH PROPOFOL;  Surgeon: Jonathon Bellows, MD;  Location[facility] ENDOSCOPY;  Service: Gastroenterology;  Laterality: N/A;   TONSILLECTOMY AND ADENOIDECTOMY  1975    Gynecologic History:  Patient's last menstrual period was 03/02/2016. Last Pap: Results were: 2021 no abnormalities  Last mammogram: 3/22 Results were:  report not available, denies abnormal mammograms   Obstetric History: G1P1001  Family History:  Family History  Problem Relation Age of Onset   Arthritis Mother    Hypertension Mother    Depression Mother    Arthritis Maternal Grandmother    Hyperlipidemia Maternal Grandmother    Diabetes Maternal Grandmother    Hypertension Maternal Grandmother    Stroke Maternal Grandfather    Hyperlipidemia Maternal Grandfather    Alzheimer's disease Maternal Grandfather    Hyperlipidemia Paternal Grandmother    Diabetes Paternal Grandmother    Hypertension Paternal Grandmother    Hyperlipidemia Paternal Grandfather    Diabetes Father 13   Hyperlipidemia Father    Dementia Father    Diabetes Maternal Aunt    Heart disease Maternal Aunt    Hypertension Maternal Aunt     Social History:  Social History   Socioeconomic History   Marital status: Married    Spouse name: Not on file   Number  of children: 1   Years of education: 16   Highest education level: Not on file  Occupational History   Occupation: [employer]  Tobacco Use   Smoking status: Never   Smokeless tobacco: Never  Vaping Use   Vaping Use: Never used  Substance and Sexual Activity   Alcohol use: Yes    Alcohol/week: 7.0 standard drinks of alcohol    Types: 7 Cans of beer per week    Comment: occassionally   Drug use: No   Sexual activity: Not Currently    Partners: Male  Other Topics  Concern   Not on file  Social History Narrative   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 Authorizing Provider  atorvastatin (LIPITOR) 20 MG tablet Take 20 mg by mouth daily.   Yes [provider]  Calcium Carb-Cholecalciferol (CALCIUM/VITAMIN D PO) Take by mouth daily.   Yes [provider]  citalopram (CELEXA) 40 MG tablet TAKE 1 TABLET(40 MG) BY MOUTH DAILY. Please schedule office visit before any future refill. 08/16/22  Yes Gwyneth Sprout, FNP  clobetasol (OLUX) 0.05 % topical foam APPLY EXTERNALLY TO THE AFFECTED AREA TWICE DAILY 05/16/20  Yes Carles Collet M, PA-C  clonazePAM (KLONOPIN) 0.5 MG tablet Take 1 tablet (0.5 mg total) by mouth 2 (two) times daily as needed for anxiety. 04/23/21  Yes Rod Can, CNM  diclofenac Sodium (VOLTAREN) 1 % GEL Apply 2 g topically 4 (four) times daily. 06/15/21  Yes Vigg, Avanti, MD  fluocinonide (LIDEX) 0.05 % external solution Apply topically. 04/09/21  Yes [provider]  hydrocortisone (ANUSOL-HC) 2.5 % rectal cream Place 1 application rectally 2 (two) times daily. 11/05/20  Yes Carles Collet M, PA-C  lidocaine (LIDODERM) 5 % Place 1 patch onto the skin daily. Remove & Discard patch within 12 hours or as directed by MD 11/10/21  Yes Birdie Sons, MD  Misc Natural Products (OSTEO BI-FLEX ADV TRIPLE ST PO) Take by mouth.   Yes [provider]  Multiple Vitamin (MULTIVITAMIN) tablet Take 1 tablet by mouth daily.   Yes [provider]   olopatadine (PATANOL) 0.1 % ophthalmic solution SMARTSIG:In Eye(s) 04/19/21  Yes [provider]    Physical Exam Vitals: Blood pressure 132/79, pulse 73, resp. rate 16, height '5\' 5"'$  (1.651 m), weight 204 lb 12.8 oz (92.9 kg), last menstrual period 03/02/2016.  General: NAD HEENT: normocephalic, anicteric Thyroid: no enlargement, no palpable nodules Pulmonary: No increased work of breathing, CTAB Cardiovascular: RRR, distal pulses 2+ Breast: Breast symmetrical, no tenderness, no palpable nodules or masses, no skin or nipple retraction present, no nipple discharge.  No axillary or supraclavicular lymphadenopathy. Abdomen: NABS, soft, non-tender, non-distended.  Umbilicus without lesions.  No hepatomegaly, splenomegaly or masses palpable. No evidence of hernia  Genitourinary:  External: Normal external female genitalia atrophy  consistent with post menopausal status .  Normal urethral meatus, normal Bartholin's and Skene's glands.    Vagina: Normal vaginal mucosa, no evidence of prolapse.  Only able to introduce  one finger for bimanual exam, pt reported pain with 2 fingers, discomfort noted once speculum introduced and opened to visualize cervix.  Some vaginal tone present   Cervix: Grossly normal in appearance, no bleeding  Uterus: Non-enlarged, mobile, normal contour.  No CMT  Adnexa: ovaries non-enlarged, no adnexal masses  Rectal: deferred  Lymphatic: no evidence of inguinal lymphadenopathy Extremities: no edema, erythema, or tenderness Neurologic: Grossly intact Psychiatric: mood appropriate, affect full       Assessment: 56 y.o. G1P1001 routine annual exam  Plan: Problem List Items Addressed This Visit   None Visit Diagnoses     Well woman exam    -  Primary   Cervical cancer screening           1) Mammogram - recommend yearly screening mammogram.  Mammogram Is up to date  2) STI screening  wasoffered and declined  3) ASCCP guidelines and rational discussed.   Patient opts for every 3 years screening interval  4) Osteoporosis  - per USPTF routine screening DEXA at age 88   Consider FDA-approved medical therapies in postmenopausal women and men aged 66 years and older, based on the following: a) A hip or vertebral (clinical or morphometric) fracture b) T-score ? -2.5 at the femoral neck or spine after appropriate evaluation to exclude secondary causes C) Low bone mass (T-score between -1.0 and -2.5 at the femoral neck or spine) and a 10-year probability of a hip fracture ? 3% or a 10-year probability of a major osteoporosis-related fracture ? 20% based on the [place]-adapted WHO algorithm   5) Routine healthcare maintenance including cholesterol, diabetes screening discussed managed by PCP  6) Colonoscopy UP to date .  Screening recommended starting at age 22 for average risk individuals, age 68 for individuals deemed at increased risk (including African Americans) and recommended to continue until age 56.  For patient age 56-85 individualized approach is recommended.  Gold standard screening is via colonoscopy, Cologuard screening is an acceptable alternative for patient unwilling or unable to undergo colonoscopy.  "Colorectal cancer screening for average?risk adults: 2018 guideline update from the American Cancer Society"CA: A Cancer Journal for Clinicians: May 18, 2017   7) Vaginal Trophy: reviewed normal genitalia consistent with postmenopausal status, if she does become sexually active again and notices discomfort treatment is available.    Roberto Scales, CNM  [facility], [facility] 12/08/2022, 1:56 PM

## 2022-12-15 LAB — CYTOLOGY - PAP
Comment: NEGATIVE
Diagnosis: NEGATIVE
High risk HPV: NEGATIVE

## 2023-09-29 HISTORY — PX: TOTAL HIP ARTHROPLASTY: SHX124

## 2023-11-10 ENCOUNTER — Telehealth: Payer: Self-pay | Admitting: Licensed Practical Nurse

## 2023-11-10 NOTE — Telephone Encounter (Signed)
Left message for patient to call office back to schedule annual appt with LMD after 12/09/23

## 2023-11-26 IMAGING — CR DG HIP (WITH OR WITHOUT PELVIS) 2-3V*R*
1 series · 3 of 3 positions shown · non-contrast
Comparison: None.

CLINICAL DATA: Right hip pain.

EXAM:
DG HIP (WITH OR WITHOUT PELVIS) 2-3V RIGHT

[Series 1: dg hip unilat w or w/o pelvis 2-3 views  · non-contrast · 0.14mm/px · 3 of 3 slices shown]
[im 1/3]
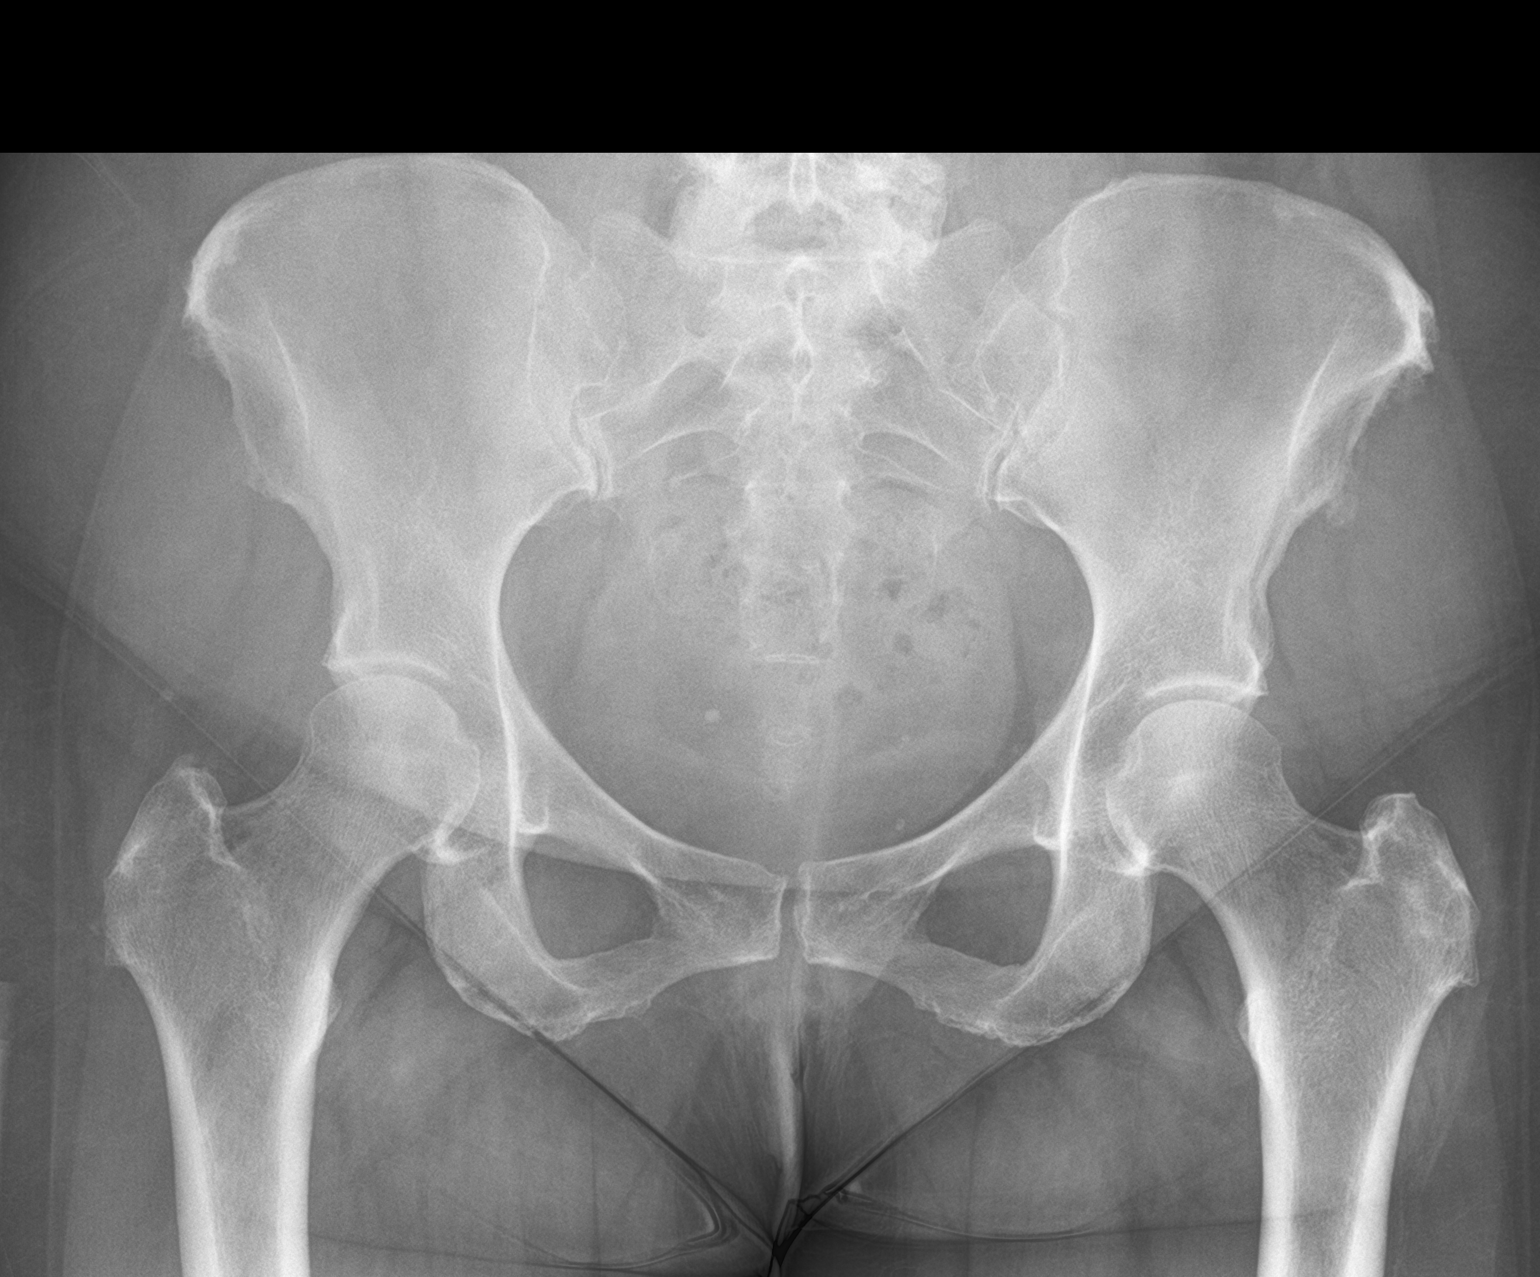
[im 2/3]
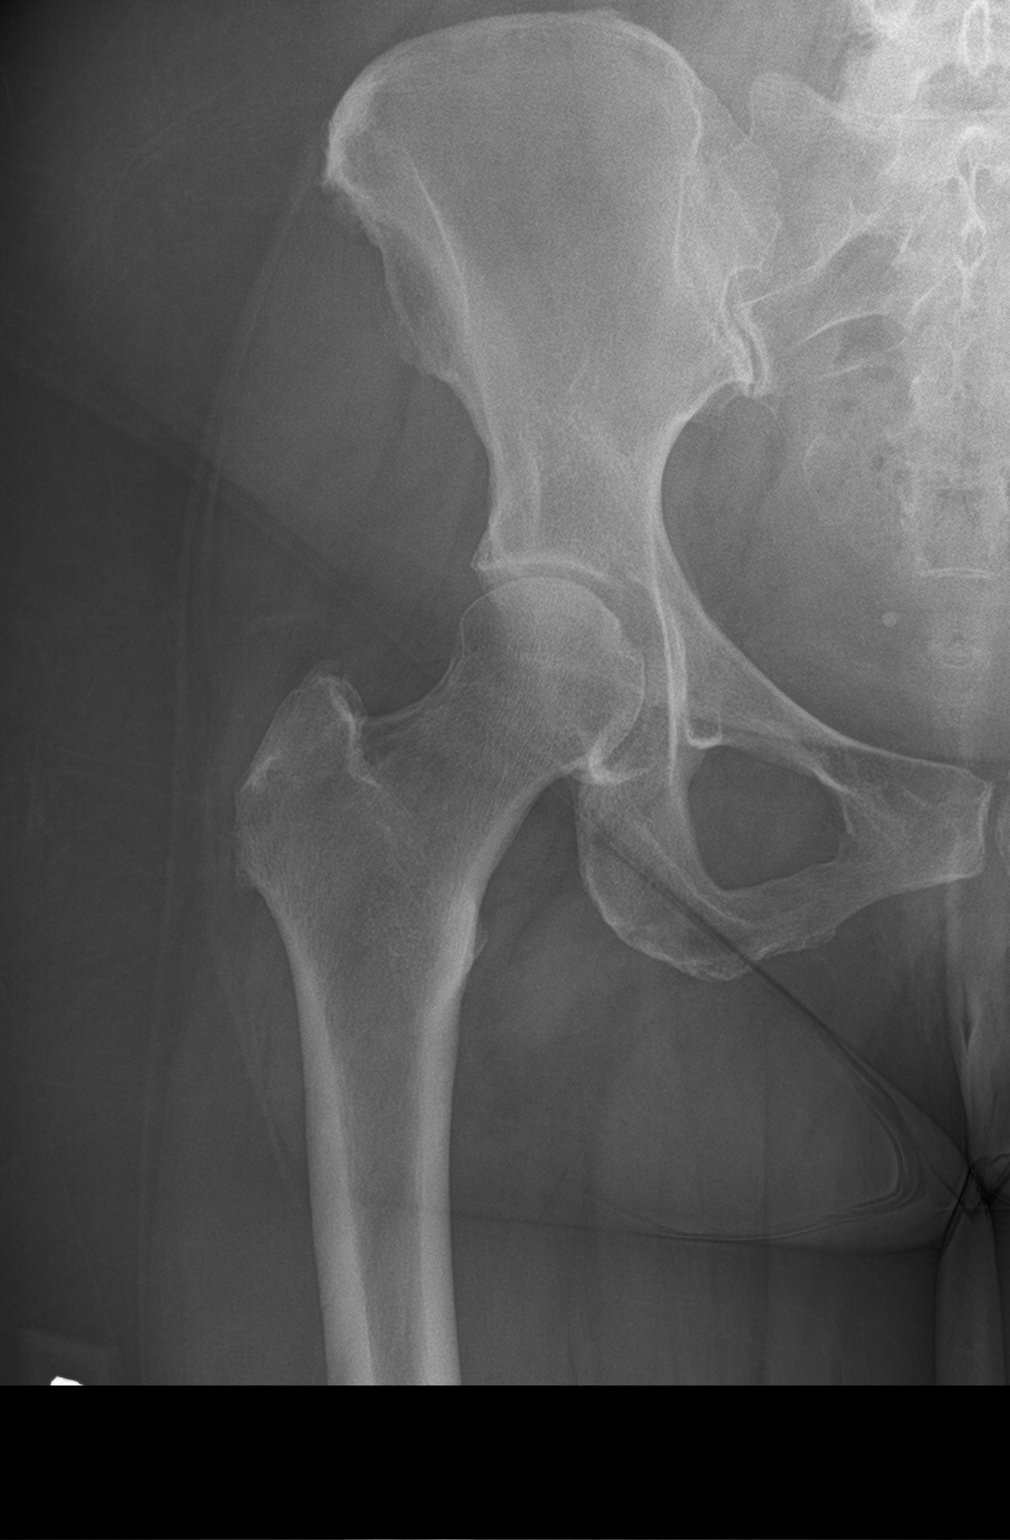
[im 3/3]
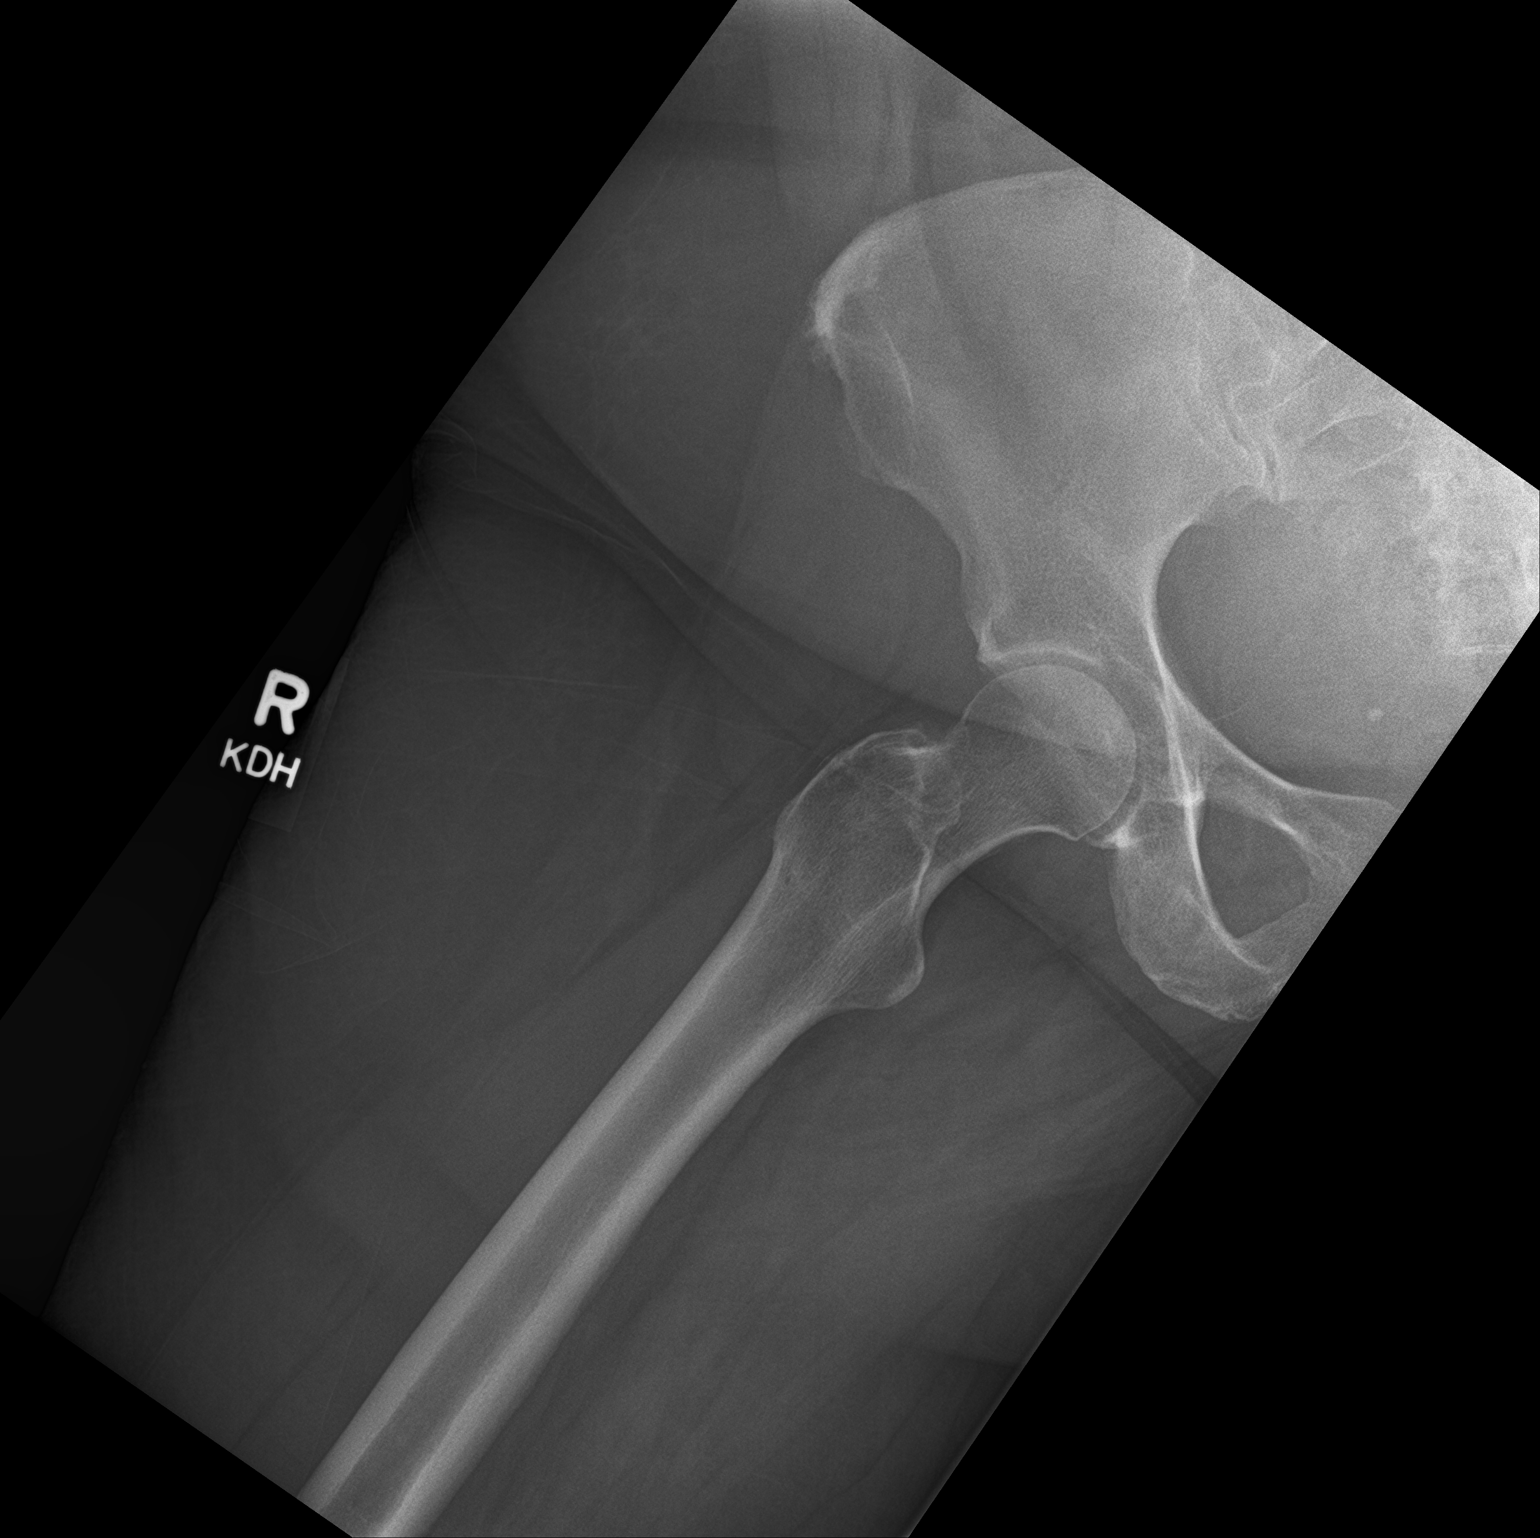

[3 of 3 positions shown; findings below may reference images not displayed]

FINDINGS: There is no evidence of hip fracture or dislocation. There is no
evidence of arthropathy or other focal bone abnormality.
IMPRESSION: Negative.

## 2024-03-15 ENCOUNTER — Ambulatory Visit: Payer: Managed Care, Other (non HMO) | Admitting: Licensed Practical Nurse

## 2024-03-15 ENCOUNTER — Encounter: Payer: Self-pay | Admitting: Licensed Practical Nurse

## 2024-03-15 VITALS — BP 120/66 | HR 79 | Ht 65.0 in | Wt 185.6 lb

## 2024-03-15 DIAGNOSIS — N952 Postmenopausal atrophic vaginitis: Secondary | ICD-10-CM

## 2024-03-15 DIAGNOSIS — Z01419 Encounter for gynecological examination (general) (routine) without abnormal findings: Secondary | ICD-10-CM | POA: Diagnosis not present

## 2024-03-15 MED ORDER — PREMARIN 0.625 MG/GM VA CREA: 0.5000 g | TOPICAL_CREAM | Freq: Every day | VAGINAL | 12 refills | Status: AC

## 2024-03-15 NOTE — Progress Notes (Signed)
 Gynecology Annual Exam  PCP: Nira Retort  Chief Complaint: No chief complaint on file.   History of Present Illness: Patient is a 58 y.o. G1P1001 presents for annual exam. The patient reports pain with IC, would like estrogen.   LMP: Patient's last menstrual period was 03/02/2016.    The patient is sexually active. She currently uses post menopausal status for contraception. She has dyspareunia.  The patient does perform self breast exams.  There is no notable family history of breast or ovarian cancer in her family.  The patient wears seatbelts: yes.   The patient has regular exercise: yes.  Recovering from hip surgery in October, now walking 1.5 miles per day   The patient reports current symptoms of depression.  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   HENT: Negative.    Eyes: Negative.   Respiratory: Negative.    Cardiovascular: Negative.   Gastrointestinal: Negative.   Genitourinary:        Painful intercourse   Musculoskeletal: Negative.   Skin: Negative.   Neurological: Negative.   Endo/Heme/Allergies: Negative.   Psychiatric/Behavioral:  Positive for depression. The patient is nervous/anxious.      Past Medical History:  Patient Active Problem List   Diagnosis Date Noted Date Diagnosed   Adenomatous polyp of colon 11/16/2022    Pharyngitis due to Streptococcus species 12/17/2021    Swollen gland 12/17/2021    Acute recurrent pansinusitis 12/17/2021    Adenomatous polyps 01/02/2019     08/04/18 colonoscopy. Next colonoscopy due in 3 years    Colon cancer screening 06/07/2017    Palpitations 09/15/2016    Depression with anxiety 12/02/2015     Past Surgical History:  Past Surgical History:   Procedure Laterality Date   CARPAL TUNNEL RELEASE     CESAREAN SECTION  1997   COLONOSCOPY WITH PROPOFOL N/A 08/04/2018   Procedure: COLONOSCOPY WITH PROPOFOL;  Surgeon: Wyline Mood, MD;  [facility] ENDOSCOPY;  Service: Gastroenterology;  Laterality: N/A;   COLONOSCOPY WITH PROPOFOL N/A 12/29/2021   Procedure: COLONOSCOPY WITH PROPOFOL;  Surgeon: Wyline Mood, MD;  Location: [facility] ENDOSCOPY;  Service: Gastroenterology;  Laterality: N/A;   COLONOSCOPY WITH PROPOFOL N/A 11/16/2022   Procedure: COLONOSCOPY WITH PROPOFOL;  Surgeon: Wyline Mood, MD;  Location: [facility] ENDOSCOPY;  Service: Gastroenterology;  Laterality: N/A;   TONSILLECTOMY AND ADENOIDECTOMY  1975   TOTAL HIP ARTHROPLASTY Right 09/29/2023    Gynecologic History:  Patient's last menstrual period was 03/02/2016. Contraception: post menopausal status Last Pap: Results were: 2023 no abnormalities  Last mammogram: February 23, 2024 at [facility]  Results were:  WNL   Obstetric History: G1P1001  Family History:  Family History  Problem Relation Age of Onset   Arthritis Mother    Hypertension Mother    Depression Mother    Arthritis Maternal Grandmother    Hyperlipidemia Maternal Grandmother    Diabetes Maternal Grandmother    Hypertension Maternal Grandmother    Stroke Maternal Grandfather    Hyperlipidemia Maternal Grandfather    Alzheimer's disease Maternal Grandfather    Hyperlipidemia Paternal Grandmother    Diabetes Paternal Grandmother    Hypertension Paternal Grandmother    Hyperlipidemia Paternal Grandfather    Diabetes Father  70   Hyperlipidemia Father    Dementia Father    Diabetes Maternal Aunt    Heart disease Maternal Aunt    Hypertension Maternal Aunt     Social History:  Social History   Socioeconomic History   Marital status: Married    Spouse name: Not on file   Number of children: 1   Years of education: 16   Highest education level: Not on file  Occupational History   Occupation: [employer]   Tobacco Use   Smoking status: Never   Smokeless tobacco: Never  Vaping Use   Vaping status: Never Used  Substance and Sexual Activity   Alcohol use: Yes    Alcohol/week: 7.0 standard drinks of alcohol    Types: 7 Cans of beer per week    Comment: occassionally   Drug use: No   Sexual activity: Yes    Partners: Male  Other Topics Concern   Not on file  Social History Narrative   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[place] [employer]   Social Drivers of Health   Financial Resource Strain: Low Risk  (08/12/2023)   Received from [facility]   Overall Financial Resource Strain (CARDIA)    Difficulty of Paying Living Expenses: Not hard at all  Food Insecurity: No Food Insecurity (08/12/2023)   Received from [facility]   Hunger Vital Sign    Worried About Running Out of Food in the Last Year: Never true    Ran Out of Food in the Last Year: Never true  Transportation Needs: No Transportation Needs (08/12/2023)   Received from [facility] - Transportation    In the past 12 months, has lack of transportation kept you from medical appointments or from getting medications?: No    Lack of Transportation (Non-Medical): No  Physical Activity: Not on file  Stress: Not on file  Social Connections: Not on file  Intimate Partner Violence: Not on file    Allergies:  Allergies  Allergen Reactions   Penicillins Hives    Medications: Prior to Admission medications   Medication Sig Start Date End Date Taking? Authorizing Provider  atorvastatin (LIPITOR) 20 MG tablet Take 20 mg by mouth daily.   Yes [provider]  Calcium Carb-Cholecalciferol (CALCIUM/VITAMIN D PO) Take by mouth daily.   Yes [provider]  citalopram (CELEXA) 40 MG tablet TAKE 1 TABLET(40 MG) BY MOUTH DAILY. Please schedule office visit before any future refill. 08/16/22  Yes Jacky Kindle, FNP  conjugated estrogens  (PREMARIN) vaginal cream Place 0.25 Applicatorfuls vaginally daily. 03/15/24  Yes Sakshi Sermons, Courtney Heys, CNM  Multiple Vitamin (MULTIVITAMIN) tablet Take 1 tablet by mouth daily.   Yes [provider]  olopatadine (PATANOL) 0.1 % ophthalmic solution SMARTSIG:In Eye(s) 04/19/21  Yes [provider]  clobetasol (OLUX) 0.05 % topical foam APPLY EXTERNALLY TO THE AFFECTED AREA TWICE DAILY Patient not taking: Reported on 03/15/2024 05/16/20   Trey Sailors, PA-C  clonazePAM (KLONOPIN) 0.5 MG tablet Take 1 tablet (0.5 mg total) by mouth 2 (two) times daily as needed for anxiety. Patient not taking: Reported on 03/15/2024 04/23/21   Tresea Mall, CNM  diclofenac Sodium (VOLTAREN) 1 % GEL Apply 2 g topically 4 (four) times daily. Patient not taking: Reported on 03/15/2024 06/15/21   Loura Pardon, MD  fluocinonide (LIDEX) 0.05 % external solution Apply topically. Patient not taking: Reported on 03/15/2024 04/09/21   [provider]  hydrocortisone (ANUSOL-HC) 2.5 % rectal cream Place 1 application rectally 2 (two) times daily. Patient not taking: Reported on 03/15/2024 11/05/20   Trey Sailors, PA-C  lidocaine (LIDODERM) 5 % Place 1 patch onto the skin daily. Remove & Discard patch within 12 hours or as directed by MD Patient not taking: Reported on 03/15/2024 11/10/21   Malva Limes, MD  Misc Natural Products (OSTEO BI-FLEX ADV TRIPLE ST PO) Take by mouth. Patient not taking: Reported on 03/15/2024    [provider]    Physical Exam Vitals: Blood pressure 120/66, pulse 79, height 5\' 5"  (1.651 m), weight 185 lb 9.6 oz (84.2 kg), last menstrual period 03/02/2016.  General: NAD HEENT: normocephalic, anicteric Thyroid: no enlargement, no palpable nodules Pulmonary: No increased work of breathing, CTAB Cardiovascular: RRR, distal pulses 2+ Breast: Breast symmetrical, no tenderness, no palpable nodules or masses, no skin or nipple retraction present, no nipple  discharge.  No axillary or supraclavicular lymphadenopathy. Abdomen: NABS, soft, non-tender, non-distended.  Umbilicus without lesions.  No hepatomegaly, splenomegaly or masses palpable. No evidence of hernia  Genitourinary:  External: Atrophic  external female genitalia-some redness noted at introitus.  Normal urethral meatus, normal Bartholin's and Skene's glands.    Vagina:  declines exam   Cervix:    Uterus:    Adnexa:    Rectal: deferred  Lymphatic: no evidence of inguinal lymphadenopathy Extremities: no edema, erythema, or tenderness Neurologic: Grossly intact Psychiatric: mood appropriate, affect full  Assessment: 58 y.o. G1P1001 routine annual exam  Plan: Problem List Items Addressed This Visit   None Visit Diagnoses       Vaginal atrophy    -  Primary   Relevant Medications   conjugated estrogens (PREMARIN) vaginal cream       1) Mammogram - recommend yearly screening mammogram.  Mammogram Is up to date   2) STI screening  wasoffered and declined  3) ASCCP guidelines and rational discussed.  Patient opts for every 3 years screening interval dues 2026   4) Contraception - the patient is currently using  post menopausal status.    5) Colonoscopy last done 2023  -- Screening recommended starting at age 22 for average risk individuals, age 28 for individuals deemed at increased risk (including African Americans) and recommended to continue until age 60.  For patient age 85-85 individualized approach is recommended.  Gold standard screening is via colonoscopy, Cologuard screening is an acceptable alternative for patient unwilling or unable to undergo colonoscopy.  "Colorectal cancer screening for average?risk adults: 2018 guideline update from the American Cancer Society"CA: A Cancer Journal for Clinicians: May 18, 2017   6) Routine healthcare maintenance including cholesterol, diabetes screening discussed managed by PCP  7) Return in about 3 months (around 06/15/2024) for  medication follow  up .  Carie Caddy, CNM  [facility] 03/15/2024, 8:57 AM

## 2024-04-05 ENCOUNTER — Ambulatory Visit: Admitting: Licensed Practical Nurse
